# Patient Record
Sex: Female | Born: 1985 | Race: Black or African American | Hispanic: No | Marital: Single | Smoking: Never smoker
Health system: Southern US, Community
[De-identification: ages and names within clinical notes are randomized; demographics above are authoritative.]

## PROBLEM LIST (undated history)

## (undated) DIAGNOSIS — B999 Unspecified infectious disease: Secondary | ICD-10-CM

## (undated) DIAGNOSIS — IMO0002 Reserved for concepts with insufficient information to code with codable children: Secondary | ICD-10-CM

## (undated) DIAGNOSIS — Z3042 Encounter for surveillance of injectable contraceptive: Secondary | ICD-10-CM

## (undated) DIAGNOSIS — F32A Depression, unspecified: Secondary | ICD-10-CM

## (undated) DIAGNOSIS — F329 Major depressive disorder, single episode, unspecified: Secondary | ICD-10-CM

## (undated) DIAGNOSIS — F419 Anxiety disorder, unspecified: Secondary | ICD-10-CM

## (undated) DIAGNOSIS — G5622 Lesion of ulnar nerve, left upper limb: Principal | ICD-10-CM

## (undated) DIAGNOSIS — F410 Panic disorder [episodic paroxysmal anxiety] without agoraphobia: Principal | ICD-10-CM

## (undated) DIAGNOSIS — R112 Nausea with vomiting, unspecified: Secondary | ICD-10-CM

## (undated) HISTORY — DX: Depression, unspecified: F32.A

## (undated) HISTORY — DX: Encounter for surveillance of injectable contraceptive: Z30.42

## (undated) HISTORY — PX: WISDOM TOOTH EXTRACTION: SHX21

## (undated) HISTORY — DX: Anxiety disorder, unspecified: F41.9

## (undated) HISTORY — DX: Lesion of ulnar nerve, left upper limb: G56.22

## (undated) HISTORY — DX: Unspecified infectious disease: B99.9

## (undated) HISTORY — DX: Reserved for concepts with insufficient information to code with codable children: IMO0002

## (undated) HISTORY — PX: NO PAST SURGERIES: SHX2092

## (undated) HISTORY — DX: Panic disorder (episodic paroxysmal anxiety): F41.0

## (undated) HISTORY — DX: Major depressive disorder, single episode, unspecified: F32.9

---
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
## 2007-03-13 DIAGNOSIS — IMO0002 Reserved for concepts with insufficient information to code with codable children: Secondary | ICD-10-CM

## 2007-03-13 DIAGNOSIS — R87619 Unspecified abnormal cytological findings in specimens from cervix uteri: Secondary | ICD-10-CM

## 2007-03-13 HISTORY — DX: Unspecified abnormal cytological findings in specimens from cervix uteri: R87.619

## 2007-03-13 HISTORY — DX: Reserved for concepts with insufficient information to code with codable children: IMO0002

## 2007-12-21 ENCOUNTER — Emergency Department (HOSPITAL_COMMUNITY): Admission: EM | Admit: 2007-12-21 | Discharge: 2007-12-22 | Payer: Self-pay | Admitting: Emergency Medicine

## 2008-01-29 ENCOUNTER — Other Ambulatory Visit: Admission: RE | Admit: 2008-01-29 | Discharge: 2008-01-29 | Payer: Self-pay | Admitting: Obstetrics and Gynecology

## 2008-06-17 ENCOUNTER — Other Ambulatory Visit: Admission: RE | Admit: 2008-06-17 | Discharge: 2008-06-17 | Payer: Self-pay | Admitting: Obstetrics and Gynecology

## 2010-12-12 LAB — URINE MICROSCOPIC-ADD ON

## 2010-12-12 LAB — URINALYSIS, ROUTINE W REFLEX MICROSCOPIC
Glucose, UA: NEGATIVE
Ketones, ur: NEGATIVE
Nitrite: NEGATIVE
Specific Gravity, Urine: 1.029
pH: 8

## 2011-03-13 HISTORY — PX: COLPOSCOPY: SHX161

## 2011-03-13 NOTE — L&D Delivery Note (Signed)
Delivery Note Complete w/ urge to push at 2126.  Pushing started at 2138, and pushed well to SVD at 9:47 PM a viable female "Diane Craig" was delivered via Vaginal, Spontaneous Delivery (Presentation: ; OA ).  APGAR: 6, 9; weight 4 lb 13.6 oz (2200 g).  NICU present for delivery.  Newborn w/ lusty, spontaneous cry as delivered.  Cord around LLE (below the knee) x2.   Placenta status: Intact, Spontaneous, Schultz--sent to pathology.  Uterine atony noted, following placenta delivery, and of cytotec placed pr, and uterus aggressively massaged with good result.  Will CTO closely. Pathology.  Cord: 3 vessels with the following complications: None.  Cord pH: not collected  Anesthesia: Epidural  Episiotomy: None Lacerations: None Suture Repair: n/a Est. Blood Loss (mL): 350  Mom to AICU.  Baby to nursery-stable. Will continue magnesium sulfate for 24 hrs.  At present mom not interested in breast feeding, but enc'd and rec'd to her.   Pt, newborn, and FOB all bonding well. BP remains elevated s/p delivery, and pt to receive IV Labetalol now, and will CTO closely with possibility of ordering po antihypertensive if need be. .. Filed Vitals:   01/09/12 2101 01/09/12 2200 01/09/12 2216 01/09/12 2231  BP: 153/101 187/141 170/112 174/107  Pulse: 91 163 126 110  Temp:      TempSrc:      Resp: 16     Height:      Weight:      SpO2:      C/w MD prn.    Richel Millspaugh H 01/09/2012, 10:37 PM

## 2011-07-02 ENCOUNTER — Telehealth: Payer: Self-pay | Admitting: Obstetrics and Gynecology

## 2011-07-02 MED ORDER — ONDANSETRON 8 MG PO TBDP: 8.0000 mg | ORAL_TABLET | Freq: Three times a day (TID) | ORAL | Status: AC | PRN

## 2011-07-02 NOTE — Telephone Encounter (Signed)
TC from pt. LMP 04/28/11.  Requesting medication for nausea.  Reviewed diet and comfort measures.   Per SL may call in Zofran.   Pt advised to take Colace qd and increase water intake.   To call if no improvement.  Pt verbalizes comprehension.

## 2011-07-16 ENCOUNTER — Other Ambulatory Visit: Payer: Self-pay | Admitting: Obstetrics and Gynecology

## 2011-07-16 ENCOUNTER — Encounter: Payer: Self-pay | Admitting: Obstetrics and Gynecology

## 2011-07-16 ENCOUNTER — Ambulatory Visit (INDEPENDENT_AMBULATORY_CARE_PROVIDER_SITE_OTHER): Payer: Private Health Insurance - Indemnity | Admitting: Obstetrics and Gynecology

## 2011-07-16 DIAGNOSIS — Z331 Pregnant state, incidental: Secondary | ICD-10-CM

## 2011-07-16 LAB — POCT URINALYSIS DIPSTICK
Glucose, UA: NEGATIVE
Ketones, UA: NEGATIVE
Spec Grav, UA: 1.01
Urobilinogen, UA: NEGATIVE

## 2011-07-16 NOTE — Progress Notes (Signed)
Pt states was prescribed Clobetasol hair solution for dandruff.   Is classified Cat C so advised not to use unless absolutely necessary.  To discuss further at NV.  Pt verbalizes comprehension.

## 2011-07-17 LAB — PRENATAL PANEL VII
Basophils Relative: 0 % (ref 0–1)
Eosinophils Absolute: 0.1 10*3/uL (ref 0.0–0.7)
HCT: 38.3 % (ref 36.0–46.0)
Hemoglobin: 12.2 g/dL (ref 12.0–15.0)
Lymphs Abs: 1.5 10*3/uL (ref 0.7–4.0)
MCH: 26.1 pg (ref 26.0–34.0)
MCHC: 31.9 g/dL (ref 30.0–36.0)
MCV: 82 fL (ref 78.0–100.0)
Monocytes Absolute: 0.8 10*3/uL (ref 0.1–1.0)
Monocytes Relative: 11 % (ref 3–12)
RBC: 4.67 MIL/uL (ref 3.87–5.11)
Rh Type: POSITIVE
Rubella: 260.2 IU/mL — ABNORMAL HIGH

## 2011-07-17 LAB — CULTURE, OB URINE
Colony Count: NO GROWTH
Organism ID, Bacteria: NO GROWTH

## 2011-07-17 LAB — VARICELLA ZOSTER ANTIBODY, IGG: Varicella IgG: 1.72 {ISR} — ABNORMAL HIGH

## 2011-07-18 LAB — HEMOGLOBINOPATHY EVALUATION
Hgb F Quant: 0 % (ref 0.0–2.0)
Hgb S Quant: 0 %

## 2011-07-18 LAB — VARICELLA ZOSTER ANTIBODY, IGM: Varicella Zoster Ab IgM: 0.2 (ref <0.91)

## 2011-07-25 ENCOUNTER — Other Ambulatory Visit: Payer: Private Health Insurance - Indemnity

## 2011-07-25 ENCOUNTER — Ambulatory Visit (INDEPENDENT_AMBULATORY_CARE_PROVIDER_SITE_OTHER): Payer: Private Health Insurance - Indemnity

## 2011-07-25 ENCOUNTER — Other Ambulatory Visit: Payer: Self-pay | Admitting: Obstetrics and Gynecology

## 2011-07-25 DIAGNOSIS — Z331 Pregnant state, incidental: Secondary | ICD-10-CM

## 2011-08-01 ENCOUNTER — Ambulatory Visit (INDEPENDENT_AMBULATORY_CARE_PROVIDER_SITE_OTHER): Payer: Private Health Insurance - Indemnity | Admitting: Obstetrics and Gynecology

## 2011-08-01 ENCOUNTER — Encounter: Payer: Self-pay | Admitting: Obstetrics and Gynecology

## 2011-08-01 VITALS — BP 110/70 | Ht 65.0 in | Wt 163.0 lb

## 2011-08-01 DIAGNOSIS — O2601 Excessive weight gain in pregnancy, first trimester: Secondary | ICD-10-CM | POA: Insufficient documentation

## 2011-08-01 DIAGNOSIS — B9689 Other specified bacterial agents as the cause of diseases classified elsewhere: Secondary | ICD-10-CM

## 2011-08-01 DIAGNOSIS — IMO0002 Reserved for concepts with insufficient information to code with codable children: Secondary | ICD-10-CM

## 2011-08-01 DIAGNOSIS — A499 Bacterial infection, unspecified: Secondary | ICD-10-CM

## 2011-08-01 DIAGNOSIS — R635 Abnormal weight gain: Secondary | ICD-10-CM

## 2011-08-01 DIAGNOSIS — Z331 Pregnant state, incidental: Secondary | ICD-10-CM

## 2011-08-01 DIAGNOSIS — N760 Acute vaginitis: Secondary | ICD-10-CM

## 2011-08-01 LAB — POCT WET PREP (WET MOUNT)
Bacteria Wet Prep HPF POC: POSITIVE
Clue Cells Wet Prep Whiff POC: POSITIVE

## 2011-08-01 MED ORDER — TERCONAZOLE 0.4 % VA CREA: 1.0000 | TOPICAL_CREAM | Freq: Every day | VAGINAL | Status: AC

## 2011-08-01 MED ORDER — METRONIDAZOLE 500 MG PO TABS: 500.0000 mg | ORAL_TABLET | Freq: Two times a day (BID) | ORAL | Status: DC

## 2011-08-01 NOTE — Progress Notes (Signed)
Pt wants PNV rf that doesn't cause HA's

## 2011-08-01 NOTE — Progress Notes (Addendum)
Patient ID: Diane Craig, female   DOB: 1985-11-25, 26 y.o.   MRN: [id number] Diane Craig is a 26 y.o. female presenting for new ob visit. States some nausea taking zofran and fewer headaches with zofran but needing stool softner then. @MED  @IPILAPH @ OB History    Grav Para Term Preterm Abortions TAB SAB Ect Mult Living   1              Past Medical History  Diagnosis Date  . Abnormal Pap smear 2009    COLPO  LAST PAP 03/2011  . Infection     YEAST X 1   Past Surgical History  Procedure Date  . No past surgeries    Family History: family history includes Hypertension in her maternal grandfather. Social History:  reports that she has never smoked. She has never used smokeless tobacco. She reports that she does not drink alcohol or use illicit drugs.  @ROS @    Blood pressure 110/70, height 5\' 5"  (1.651 m), weight 163 lb (73.936 kg), last menstrual period 04/28/2011. Physical exam: Calm, no distress, HEENT wnl lungs clear bilaterally, AP RRR, abd soft, gravid, nt, bowel sounds active, abdomen nontender, Fundal height 12 week size Normal hair distrubition mons pubis,  EGBUS WNL, sterile speculum exam,  vagina pink, moist normal rugae,  cerix LTC, no cervical motion tenderness, No adnexal masses or tenderness Scant white discharge FHTS 16 uterus firm 12 weeks size DTR +2 no clonus No edema to lower extremities Current Outpatient Prescriptions on File Prior to Visit  Medication Sig Dispense Refill  . ondansetron (ZOFRAN-ODT) 8 MG disintegrating tablet Take 8 mg by mouth every 8 (eight) hours as needed. UNSURE OF DOSAGE      . Prenatal Vit-Fe Sulfate-FA (PRENATAL VITAMIN PO) Take 1 tablet by mouth. ONE A DAY PRENATAL WITH DHA      . metroNIDAZOLE (FLAGYL) 500 MG tablet Take 1 tablet (500 mg total) by mouth 2 (two) times daily.  14 tablet  0   Prenatal labs: Varicella immune ABO, Rh: B/POS/-- (05/06 0955) Antibody: NEG (05/06 0955) Rubella:  immune RPR: NON REAC (05/06  0955)  HBsAg: NEGATIVE (05/06 0955)  HIV: NON REACTIVE (05/06 0955)  GBS:    Wet prep +clue, +hyphae, -trich Assessment/Plan: 12 4/7 week IUP GC/CHL to labo WET PREP +clue, +hyphae, -trich PAP done 1/13 ULTRASOUND plan for 20 weeks Genetic testing for quad next visit had 1st trimester screen. Discussed risks of excessive wt gain with pregnancy, nutrition and exercise Collaboration with Dr. Vanita Panda, [facility] 08/01/2011, 1:03 PM Diane Craig, CNM

## 2011-08-02 ENCOUNTER — Telehealth: Payer: Self-pay | Admitting: Obstetrics and Gynecology

## 2011-08-02 LAB — GC/CHLAMYDIA PROBE AMP, GENITAL: Chlamydia, DNA Probe: NEGATIVE

## 2011-08-02 NOTE — Telephone Encounter (Signed)
TRIAGE/EPIC °

## 2011-08-02 NOTE — Telephone Encounter (Signed)
Spoke with pt states [facility] didn't rec'vd Prenatal rx. Called [facility] [place] approved Prenatal Vit-Fe Sulfate-FA (PRENATAL VITAMIN PO) 1 po daily x 12rf per MK. Pt aware and voices agreement.

## 2011-08-07 ENCOUNTER — Telehealth: Payer: Self-pay

## 2011-08-07 NOTE — Telephone Encounter (Signed)
Message copied by Janeece Agee on Tue Aug 07, 2011  9:01 AM ------      Message from: Janine Limbo      Created: Mon Aug 06, 2011  9:34 PM       Abnormal first trimester screen. Verify the [facility]. Schedule appointment with first available MD to discuss amnio. AVS

## 2011-08-07 NOTE — Telephone Encounter (Signed)
Informed pt 1st trimester screen results are abnormal & appt is needed to discuss results ASAP. Patient states due date was 02/03/2012 but changed to 02/07/2012. Appointment scheduled 08/08/2011 at 11:15 am with AVS. Pt agrees.

## 2011-08-08 ENCOUNTER — Ambulatory Visit (INDEPENDENT_AMBULATORY_CARE_PROVIDER_SITE_OTHER): Payer: Private Health Insurance - Indemnity | Admitting: Obstetrics and Gynecology

## 2011-08-08 VITALS — BP 102/56 | Wt 162.0 lb

## 2011-08-08 DIAGNOSIS — O285 Abnormal chromosomal and genetic finding on antenatal screening of mother: Secondary | ICD-10-CM | POA: Insufficient documentation

## 2011-08-08 DIAGNOSIS — O289 Unspecified abnormal findings on antenatal screening of mother: Secondary | ICD-10-CM

## 2011-08-08 DIAGNOSIS — Z331 Pregnant state, incidental: Secondary | ICD-10-CM

## 2011-08-08 NOTE — Progress Notes (Signed)
The patient had a first trimester screen that showed an increased risk of Down's syndrome (1 per 87). Genetic screening discussed.  Evaluation options reviewed including Harmony testing and amniocentesis.  The risks and benefits of each of those options were outlined.  The patient has declined any evaluation for now.  Written information was given to the patient to review.  Questions were answered.  The patient will return in 2-3 weeks and we will discuss the issue again at that point.

## 2011-08-08 NOTE — Progress Notes (Signed)
Pt wants PNV that is less expensive. Pt has questions about abnormal third trimester screen.

## 2011-08-27 ENCOUNTER — Ambulatory Visit (INDEPENDENT_AMBULATORY_CARE_PROVIDER_SITE_OTHER): Payer: Private Health Insurance - Indemnity | Admitting: Obstetrics and Gynecology

## 2011-08-27 ENCOUNTER — Encounter: Payer: Self-pay | Admitting: Obstetrics and Gynecology

## 2011-08-27 ENCOUNTER — Other Ambulatory Visit: Payer: Private Health Insurance - Indemnity

## 2011-08-27 VITALS — BP 112/60 | Wt 167.0 lb

## 2011-08-27 DIAGNOSIS — O289 Unspecified abnormal findings on antenatal screening of mother: Secondary | ICD-10-CM

## 2011-08-27 DIAGNOSIS — Z331 Pregnant state, incidental: Secondary | ICD-10-CM

## 2011-08-27 DIAGNOSIS — O285 Abnormal chromosomal and genetic finding on antenatal screening of mother: Secondary | ICD-10-CM

## 2011-08-27 NOTE — Progress Notes (Signed)
Pt without complaints today

## 2011-08-27 NOTE — Progress Notes (Signed)
Doing well overall, but with questions regarding elevated Down's risk on 1st trimester screen (1:87).  Questions reviewed, patient still declines Harmony or amniocentesis.  Unsure if she will have AFP--will decide by NV. [place] NV for anatomy.

## 2011-09-10 ENCOUNTER — Encounter: Payer: Self-pay | Admitting: Obstetrics and Gynecology

## 2011-09-10 ENCOUNTER — Ambulatory Visit (INDEPENDENT_AMBULATORY_CARE_PROVIDER_SITE_OTHER): Payer: Private Health Insurance - Indemnity

## 2011-09-10 ENCOUNTER — Ambulatory Visit (INDEPENDENT_AMBULATORY_CARE_PROVIDER_SITE_OTHER): Payer: Private Health Insurance - Indemnity | Admitting: Obstetrics and Gynecology

## 2011-09-10 VITALS — BP 112/54 | Wt 167.0 lb

## 2011-09-10 DIAGNOSIS — O26849 Uterine size-date discrepancy, unspecified trimester: Secondary | ICD-10-CM

## 2011-09-10 DIAGNOSIS — O285 Abnormal chromosomal and genetic finding on antenatal screening of mother: Secondary | ICD-10-CM

## 2011-09-10 DIAGNOSIS — Z331 Pregnant state, incidental: Secondary | ICD-10-CM

## 2011-09-10 DIAGNOSIS — O289 Unspecified abnormal findings on antenatal screening of mother: Secondary | ICD-10-CM

## 2011-09-10 NOTE — Progress Notes (Signed)
No complaints

## 2011-09-10 NOTE — Progress Notes (Signed)
Increased risk of Down syndrome based on first trimester screen (1: 87).  Declines  AFP, amniocentesis, and harmony screen. Ultrasound: Single gestation, normal fluid, cervix 3.23 cm, normal anatomy, female, 18 weeks and 1 day (18th percentile). Doing well. Return to office in 4 weeks.  Repeat ultrasound for growth/small for gestational age next visit. Dr. Stefano Gaul

## 2011-09-20 ENCOUNTER — Other Ambulatory Visit: Payer: Self-pay | Admitting: Obstetrics and Gynecology

## 2011-09-20 DIAGNOSIS — O285 Abnormal chromosomal and genetic finding on antenatal screening of mother: Secondary | ICD-10-CM

## 2011-09-20 LAB — US OB DETAIL + 14 WK

## 2011-10-08 ENCOUNTER — Ambulatory Visit: Payer: Self-pay

## 2011-10-08 ENCOUNTER — Ambulatory Visit (INDEPENDENT_AMBULATORY_CARE_PROVIDER_SITE_OTHER): Payer: Private Health Insurance - Indemnity | Admitting: Obstetrics and Gynecology

## 2011-10-08 ENCOUNTER — Other Ambulatory Visit: Payer: Self-pay | Admitting: Obstetrics and Gynecology

## 2011-10-08 ENCOUNTER — Encounter: Payer: Self-pay | Admitting: Obstetrics and Gynecology

## 2011-10-08 ENCOUNTER — Ambulatory Visit (INDEPENDENT_AMBULATORY_CARE_PROVIDER_SITE_OTHER): Payer: Private Health Insurance - Indemnity

## 2011-10-08 VITALS — BP 118/58 | Wt 173.0 lb

## 2011-10-08 DIAGNOSIS — R6252 Short stature (child): Secondary | ICD-10-CM

## 2011-10-08 DIAGNOSIS — O26849 Uterine size-date discrepancy, unspecified trimester: Secondary | ICD-10-CM

## 2011-10-08 DIAGNOSIS — O289 Unspecified abnormal findings on antenatal screening of mother: Secondary | ICD-10-CM

## 2011-10-08 DIAGNOSIS — O285 Abnormal chromosomal and genetic finding on antenatal screening of mother: Secondary | ICD-10-CM

## 2011-10-08 NOTE — Progress Notes (Signed)
No complaints. . Mw,cma

## 2011-10-08 NOTE — Progress Notes (Addendum)
Refer for fetal echo secondary to inc risk for Trisomy 21 and declined confirmatory testing U/S q4wks starting at 28-30wks RTO 4wks Glucola at NV Ultrasound today consistent with dates cervix 3.1 cm normal fluid vertex anterior placenta

## 2011-10-11 LAB — US OB FOLLOW UP

## 2011-10-24 ENCOUNTER — Telehealth: Payer: Self-pay | Admitting: Obstetrics and Gynecology

## 2011-10-24 NOTE — Telephone Encounter (Signed)
TRIAGE/OB/EPIC

## 2011-10-24 NOTE — Telephone Encounter (Signed)
Lm on vm tcb rgd msg 

## 2011-10-24 NOTE — Telephone Encounter (Signed)
Pt returned Diane Craig. Pt was calling asking if she can take Robitussin. I informed the pt per protocol that Plain Robitussin is fine. Pt stated she did purchase the plain Robitussin. Pt voiced understanding.  [facility] CMA

## 2011-11-05 ENCOUNTER — Ambulatory Visit (INDEPENDENT_AMBULATORY_CARE_PROVIDER_SITE_OTHER): Payer: Private Health Insurance - Indemnity | Admitting: Obstetrics and Gynecology

## 2011-11-05 ENCOUNTER — Other Ambulatory Visit: Payer: Private Health Insurance - Indemnity

## 2011-11-05 ENCOUNTER — Encounter: Payer: Self-pay | Admitting: Obstetrics and Gynecology

## 2011-11-05 VITALS — BP 110/60 | Wt 178.0 lb

## 2011-11-05 DIAGNOSIS — Z331 Pregnant state, incidental: Secondary | ICD-10-CM

## 2011-11-05 DIAGNOSIS — O285 Abnormal chromosomal and genetic finding on antenatal screening of mother: Secondary | ICD-10-CM

## 2011-11-05 DIAGNOSIS — O289 Unspecified abnormal findings on antenatal screening of mother: Secondary | ICD-10-CM

## 2011-11-05 NOTE — Progress Notes (Signed)
[redacted]w[redacted]d Glucola today

## 2011-11-05 NOTE — Progress Notes (Signed)
Pt wants to make sure u/s showed that she is having a girl.

## 2011-11-08 LAB — GLUCOSE TOLERANCE, 1 HOUR (50G) W/O FASTING: Glucose, 1 Hour GTT: 154 mg/dL — ABNORMAL HIGH (ref 70–140)

## 2011-11-13 NOTE — Progress Notes (Signed)
Quick Note:  Abnormal 1 hour Gucola. Needs 3 hour GTT scheduled. ______ 

## 2011-11-14 ENCOUNTER — Telehealth: Payer: Self-pay | Admitting: Obstetrics and Gynecology

## 2011-11-14 DIAGNOSIS — O99810 Abnormal glucose complicating pregnancy: Secondary | ICD-10-CM

## 2011-11-14 NOTE — Telephone Encounter (Signed)
Notified pt that 1 hr glucola was elevated.  Sch pt for 3 hr GTT on 11-21-2011 mailed pt diet and appt info.

## 2011-11-20 ENCOUNTER — Ambulatory Visit (INDEPENDENT_AMBULATORY_CARE_PROVIDER_SITE_OTHER): Payer: Managed Care, Other (non HMO) | Admitting: Obstetrics and Gynecology

## 2011-11-20 ENCOUNTER — Encounter: Payer: Self-pay | Admitting: Obstetrics and Gynecology

## 2011-11-20 VITALS — BP 110/58 | Wt 182.0 lb

## 2011-11-20 DIAGNOSIS — O285 Abnormal chromosomal and genetic finding on antenatal screening of mother: Secondary | ICD-10-CM

## 2011-11-20 DIAGNOSIS — O289 Unspecified abnormal findings on antenatal screening of mother: Secondary | ICD-10-CM

## 2011-11-20 NOTE — Progress Notes (Signed)
Office Visit on 11/05/2011  Component Date Value Range Status  . RPR 11/05/2011 NON REAC  NON REAC Final  . Hemoglobin 11/05/2011 11.0* 12.0 - 15.0 g/dL Final  . Glucose, 1 Hour GTT 11/05/2011 154* 70 - 140 mg/dL Final   3hr GTT tomorrow No complaints Pt reports nl fetal echo FKCs RTO 2wks for ROB and U/S for EFW q4wks secondary to inc trisomy 21 risk

## 2011-11-22 LAB — GLUCOSE TOLERANCE, 3 HOURS
Glucose Tolerance, 1 hour: 138 mg/dL (ref 70–189)
Glucose Tolerance, 2 hour: 91 mg/dL (ref 70–164)
Glucose Tolerance, Fasting: 86 mg/dL (ref 70–104)
Glucose, GTT - 3 Hour: 119 mg/dL (ref 70–144)

## 2011-12-03 ENCOUNTER — Encounter: Payer: Private Health Insurance - Indemnity | Admitting: Obstetrics and Gynecology

## 2011-12-03 ENCOUNTER — Other Ambulatory Visit: Payer: Private Health Insurance - Indemnity

## 2011-12-04 ENCOUNTER — Ambulatory Visit (INDEPENDENT_AMBULATORY_CARE_PROVIDER_SITE_OTHER): Payer: Managed Care, Other (non HMO) | Admitting: Obstetrics and Gynecology

## 2011-12-04 ENCOUNTER — Ambulatory Visit (INDEPENDENT_AMBULATORY_CARE_PROVIDER_SITE_OTHER): Payer: Managed Care, Other (non HMO)

## 2011-12-04 VITALS — BP 122/64 | Wt 190.0 lb

## 2011-12-04 DIAGNOSIS — Z3490 Encounter for supervision of normal pregnancy, unspecified, unspecified trimester: Secondary | ICD-10-CM

## 2011-12-04 DIAGNOSIS — O285 Abnormal chromosomal and genetic finding on antenatal screening of mother: Secondary | ICD-10-CM

## 2011-12-04 DIAGNOSIS — O289 Unspecified abnormal findings on antenatal screening of mother: Secondary | ICD-10-CM

## 2011-12-04 DIAGNOSIS — Z331 Pregnant state, incidental: Secondary | ICD-10-CM

## 2011-12-04 NOTE — Progress Notes (Addendum)
[redacted]w[redacted]d No complaints Increased risk for trisomy 21 patient had growth ultrasound today showing an estimated fetal weight of 3 lbs. 5 oz. 27th percentile and was tagged with a femur length and EDC is a week less than gestational age cervix is closed placenta is anterior vertex presentation is noted and normal fluid at the 55th percentile with an AFI of 15.7 Fetal kick counts and labor precautions Patient will return to office in 2 weeks and have a repeat growth ultrasound to follow up on lagging growth with what appears to be the femur length and slightly the AC Pt reports nl fetal echo

## 2011-12-10 LAB — US OB FOLLOW UP

## 2011-12-19 ENCOUNTER — Ambulatory Visit (INDEPENDENT_AMBULATORY_CARE_PROVIDER_SITE_OTHER): Payer: Managed Care, Other (non HMO) | Admitting: Obstetrics and Gynecology

## 2011-12-19 ENCOUNTER — Other Ambulatory Visit: Payer: Self-pay | Admitting: Obstetrics and Gynecology

## 2011-12-19 ENCOUNTER — Ambulatory Visit (INDEPENDENT_AMBULATORY_CARE_PROVIDER_SITE_OTHER): Payer: Managed Care, Other (non HMO)

## 2011-12-19 VITALS — BP 120/62 | Wt 193.0 lb

## 2011-12-19 DIAGNOSIS — O285 Abnormal chromosomal and genetic finding on antenatal screening of mother: Secondary | ICD-10-CM

## 2011-12-19 DIAGNOSIS — O289 Unspecified abnormal findings on antenatal screening of mother: Secondary | ICD-10-CM

## 2011-12-19 NOTE — Progress Notes (Signed)
[redacted]w[redacted]d No complaints Ultrasound today estimated fetal weight 4 lbs. 0 oz. 12.6%, cervix 3.98 cm AFI of 15.9 cm Vertex anterior placenta and normal fluid BPP of 8 out of 8 and estimated fetal weight at the 12% today is decreased from the ultrasound previously which showed the estimated fetal weight to be at 27th percentile and femur and humerus are still lagging a.c. Is also less than gestational age, suggest followup growth in 2 weeks Questions answered rec antepartum testing.  Pt opts for weekly BPP and EFW in 2wks [facility]

## 2011-12-20 LAB — US OB FOLLOW UP

## 2011-12-25 ENCOUNTER — Ambulatory Visit (INDEPENDENT_AMBULATORY_CARE_PROVIDER_SITE_OTHER): Payer: Managed Care, Other (non HMO)

## 2011-12-25 ENCOUNTER — Ambulatory Visit (INDEPENDENT_AMBULATORY_CARE_PROVIDER_SITE_OTHER): Payer: Managed Care, Other (non HMO) | Admitting: Obstetrics and Gynecology

## 2011-12-25 VITALS — BP 110/66 | Wt 198.0 lb

## 2011-12-25 DIAGNOSIS — O285 Abnormal chromosomal and genetic finding on antenatal screening of mother: Secondary | ICD-10-CM

## 2011-12-25 DIAGNOSIS — Z331 Pregnant state, incidental: Secondary | ICD-10-CM

## 2011-12-25 DIAGNOSIS — O289 Unspecified abnormal findings on antenatal screening of mother: Secondary | ICD-10-CM

## 2011-12-25 DIAGNOSIS — O280 Abnormal hematological finding on antenatal screening of mother: Secondary | ICD-10-CM

## 2011-12-25 NOTE — Progress Notes (Signed)
[redacted]w[redacted]d Ultrasound with BPP 8/8 and normal AFI. Last growth showed 12%. Will repeat growth next week GFM No complaints

## 2011-12-25 NOTE — Progress Notes (Signed)
Pt stated no issues today. Advised pt to increase fluids . F/u today

## 2012-01-02 ENCOUNTER — Ambulatory Visit (INDEPENDENT_AMBULATORY_CARE_PROVIDER_SITE_OTHER): Payer: Managed Care, Other (non HMO)

## 2012-01-02 ENCOUNTER — Other Ambulatory Visit: Payer: Self-pay | Admitting: Obstetrics and Gynecology

## 2012-01-02 ENCOUNTER — Ambulatory Visit (INDEPENDENT_AMBULATORY_CARE_PROVIDER_SITE_OTHER): Payer: Managed Care, Other (non HMO) | Admitting: Obstetrics and Gynecology

## 2012-01-02 ENCOUNTER — Encounter: Payer: Self-pay | Admitting: Obstetrics and Gynecology

## 2012-01-02 VITALS — BP 124/90 | Wt 202.0 lb

## 2012-01-02 DIAGNOSIS — O285 Abnormal chromosomal and genetic finding on antenatal screening of mother: Secondary | ICD-10-CM

## 2012-01-02 DIAGNOSIS — O280 Abnormal hematological finding on antenatal screening of mother: Secondary | ICD-10-CM

## 2012-01-02 DIAGNOSIS — O289 Unspecified abnormal findings on antenatal screening of mother: Secondary | ICD-10-CM

## 2012-01-02 NOTE — Progress Notes (Signed)
[redacted]w[redacted]d Inc tri 21 risk and SGA - for which is getting antepartum testing Ultrasound today - EFW 5lbs 5oz, 34%, AFI 13.4, 45%, BPP 6/8, vtx, ant placenta U/S BPP 6/8 NST Reactive FKCs and Labor Precautions Declined flu vaccine RTO 1wk NST on Fri BPP and ROB in 1wk

## 2012-01-04 ENCOUNTER — Other Ambulatory Visit: Payer: Private Health Insurance - Indemnity

## 2012-01-04 ENCOUNTER — Telehealth: Payer: Self-pay | Admitting: Obstetrics and Gynecology

## 2012-01-04 LAB — US OB FOLLOW UP

## 2012-01-04 NOTE — Telephone Encounter (Signed)
Tc to pt regarding missed NST this am.  Lm on vm to call back.

## 2012-01-07 ENCOUNTER — Telehealth: Payer: Self-pay | Admitting: Obstetrics and Gynecology

## 2012-01-07 NOTE — Telephone Encounter (Signed)
Lm on pt's voice mail to call back rgd message.

## 2012-01-08 ENCOUNTER — Ambulatory Visit (INDEPENDENT_AMBULATORY_CARE_PROVIDER_SITE_OTHER): Payer: Managed Care, Other (non HMO) | Admitting: Obstetrics and Gynecology

## 2012-01-08 ENCOUNTER — Encounter: Payer: Self-pay | Admitting: Obstetrics and Gynecology

## 2012-01-08 ENCOUNTER — Inpatient Hospital Stay (HOSPITAL_COMMUNITY)
Admission: AD | Admit: 2012-01-08 | Discharge: 2012-01-12 | DRG: 774 | Disposition: A | Discharge status: Home / Self Care | Payer: 59 | Source: Ambulatory Visit | Attending: Obstetrics and Gynecology | Admitting: Obstetrics and Gynecology

## 2012-01-08 ENCOUNTER — Encounter (HOSPITAL_COMMUNITY): Payer: Self-pay

## 2012-01-08 VITALS — BP 142/82 | Wt 206.0 lb

## 2012-01-08 DIAGNOSIS — O285 Abnormal chromosomal and genetic finding on antenatal screening of mother: Secondary | ICD-10-CM | POA: Diagnosis present

## 2012-01-08 DIAGNOSIS — O1490 Unspecified pre-eclampsia, unspecified trimester: Secondary | ICD-10-CM

## 2012-01-08 DIAGNOSIS — O1414 Severe pre-eclampsia complicating childbirth: Principal | ICD-10-CM | POA: Diagnosis present

## 2012-01-08 DIAGNOSIS — IMO0002 Reserved for concepts with insufficient information to code with codable children: Secondary | ICD-10-CM

## 2012-01-08 DIAGNOSIS — O1410 Severe pre-eclampsia, unspecified trimester: Secondary | ICD-10-CM | POA: Diagnosis present

## 2012-01-08 DIAGNOSIS — R7309 Other abnormal glucose: Secondary | ICD-10-CM | POA: Insufficient documentation

## 2012-01-08 DIAGNOSIS — O365990 Maternal care for other known or suspected poor fetal growth, unspecified trimester, not applicable or unspecified: Secondary | ICD-10-CM | POA: Diagnosis present

## 2012-01-08 DIAGNOSIS — Z331 Pregnant state, incidental: Secondary | ICD-10-CM

## 2012-01-08 LAB — URINE MICROSCOPIC-ADD ON

## 2012-01-08 LAB — CBC
HCT: 32 % — ABNORMAL LOW (ref 36.0–46.0)
MCH: 26 pg (ref 26.0–34.0)
MCHC: 32.2 g/dL (ref 30.0–36.0)
MCV: 80.8 fL (ref 78.0–100.0)
Platelets: 167 10*3/uL (ref 150–400)
RDW: 14.4 % (ref 11.5–15.5)
WBC: 7.7 10*3/uL (ref 4.0–10.5)

## 2012-01-08 LAB — URINALYSIS, ROUTINE W REFLEX MICROSCOPIC
Bilirubin Urine: NEGATIVE
Ketones, ur: NEGATIVE mg/dL
Leukocytes, UA: NEGATIVE
Nitrite: NEGATIVE
Protein, ur: >300 mg/dL — AB
Specific Gravity, Urine: 1.015 (ref 1.005–1.030)
Urobilinogen, UA: 0.2 mg/dL (ref 0.0–1.0)
Urobilinogen, UA: 0.2 mg/dL (ref 0.0–1.0)
pH: 6.5 (ref 5.0–8.0)

## 2012-01-08 LAB — COMPREHENSIVE METABOLIC PANEL
AST: 23 U/L (ref 0–37)
Albumin: 2.5 g/dL — ABNORMAL LOW (ref 3.5–5.2)
BUN: 10 mg/dL (ref 6–23)
Calcium: 9 mg/dL (ref 8.4–10.5)
Creatinine, Ser: 0.88 mg/dL (ref 0.50–1.10)
Total Protein: 6.1 g/dL (ref 6.0–8.3)

## 2012-01-08 LAB — LACTATE DEHYDROGENASE: LDH: 213 U/L (ref 94–250)

## 2012-01-08 LAB — ABO/RH: ABO/RH(D): B POS

## 2012-01-08 LAB — GROUP B STREP BY PCR: Group B strep by PCR: NEGATIVE

## 2012-01-08 LAB — URIC ACID: Uric Acid, Serum: 5.5 mg/dL (ref 2.4–7.0)

## 2012-01-08 LAB — OB RESULTS CONSOLE GBS: GBS: NEGATIVE

## 2012-01-08 MED ORDER — LACTATED RINGERS IV SOLN
INTRAVENOUS | Status: DC
  Administered 2012-01-08 – 2012-01-09 (×6): via INTRAVENOUS

## 2012-01-08 MED ORDER — CITRIC ACID-SODIUM CITRATE 334-500 MG/5ML PO SOLN: 30.0000 mL | ORAL | Status: DC | PRN

## 2012-01-08 MED ORDER — MISOPROSTOL 25 MCG QUARTER TABLET
25.0000 ug | ORAL_TABLET | ORAL | Status: DC | PRN
  Filled 2012-01-08: qty 0.25

## 2012-01-08 MED ORDER — LIDOCAINE HCL (PF) 1 % IJ SOLN
30.0000 mL | INTRAMUSCULAR | Status: DC | PRN
  Filled 2012-01-08: qty 30

## 2012-01-08 MED ORDER — MAGNESIUM SULFATE 40 G IN LACTATED RINGERS - SIMPLE
2.0000 g/h | INTRAVENOUS | Status: AC
  Administered 2012-01-09 – 2012-01-10 (×2): 2 g/h via INTRAVENOUS
  Filled 2012-01-08 (×3): qty 500

## 2012-01-08 MED ORDER — OXYTOCIN 40 UNITS IN LACTATED RINGERS INFUSION - SIMPLE MED
62.5000 mL/h | INTRAVENOUS | Status: DC
  Administered 2012-01-09: 62.5 mL/h via INTRAVENOUS
  Filled 2012-01-08 (×2): qty 1000

## 2012-01-08 MED ORDER — MAGNESIUM SULFATE 40 G IN LACTATED RINGERS - SIMPLE: 2.0000 g/h | INTRAVENOUS | Status: DC

## 2012-01-08 MED ORDER — OXYTOCIN BOLUS FROM INFUSION
500.0000 mL | INTRAVENOUS | Status: DC
  Administered 2012-01-09: 500 mL via INTRAVENOUS
  Filled 2012-01-08 (×97): qty 500

## 2012-01-08 MED ORDER — TERBUTALINE SULFATE 1 MG/ML IJ SOLN: 0.2500 mg | Freq: Once | INTRAMUSCULAR | Status: DC | PRN

## 2012-01-08 MED ORDER — LACTATED RINGERS IV SOLN
500.0000 mL | INTRAVENOUS | Status: DC | PRN
  Administered 2012-01-09: 500 mL via INTRAVENOUS

## 2012-01-08 MED ORDER — MAGNESIUM SULFATE 40 MG/ML IJ SOLN: 4.0000 g | Freq: Once | INTRAMUSCULAR | Status: DC

## 2012-01-08 MED ORDER — PROMETHAZINE HCL 25 MG/ML IJ SOLN
25.0000 mg | INTRAMUSCULAR | Status: DC | PRN
  Administered 2012-01-08: 25 mg via INTRAVENOUS
  Filled 2012-01-08: qty 1

## 2012-01-08 MED ORDER — LABETALOL HCL 5 MG/ML IV SOLN
10.0000 mg | INTRAVENOUS | Status: DC | PRN
  Administered 2012-01-09: 10 mg via INTRAVENOUS
  Administered 2012-01-09 (×3): 20 mg via INTRAVENOUS
  Administered 2012-01-09: 40 mg via INTRAVENOUS
  Administered 2012-01-09 – 2012-01-10 (×5): 10 mg via INTRAVENOUS
  Filled 2012-01-08 (×5): qty 4
  Filled 2012-01-08 (×2): qty 8
  Filled 2012-01-08 (×4): qty 4
  Filled 2012-01-08: qty 8

## 2012-01-08 MED ORDER — TERBUTALINE SULFATE 1 MG/ML IJ SOLN: 0.2500 mg | Freq: Once | INTRAMUSCULAR | Status: AC | PRN

## 2012-01-08 MED ORDER — ONDANSETRON HCL 4 MG/2ML IJ SOLN: 4.0000 mg | Freq: Four times a day (QID) | INTRAMUSCULAR | Status: DC | PRN

## 2012-01-08 MED ORDER — OXYCODONE-ACETAMINOPHEN 5-325 MG PO TABS: 1.0000 | ORAL_TABLET | ORAL | Status: DC | PRN

## 2012-01-08 MED ORDER — ACETAMINOPHEN 500 MG PO TABS: 1000.0000 mg | ORAL_TABLET | ORAL | Status: DC | PRN

## 2012-01-08 MED ORDER — NALBUPHINE SYRINGE 5 MG/0.5 ML
10.0000 mg | INJECTION | INTRAMUSCULAR | Status: DC | PRN
  Administered 2012-01-08: 10 mg via INTRAVENOUS
  Filled 2012-01-08: qty 0.5
  Filled 2012-01-08: qty 1
  Filled 2012-01-08: qty 0.5

## 2012-01-08 MED ORDER — LABETALOL HCL 5 MG/ML IV SOLN
10.0000 mg | INTRAVENOUS | Status: DC | PRN
  Administered 2012-01-08: 10 mg via INTRAVENOUS
  Filled 2012-01-08: qty 4

## 2012-01-08 MED ORDER — OXYTOCIN 40 UNITS IN LACTATED RINGERS INFUSION - SIMPLE MED
1.0000 m[IU]/min | INTRAVENOUS | Status: DC
  Administered 2012-01-08: 1 m[IU]/min via INTRAVENOUS
  Administered 2012-01-09: 15 m[IU]/min via INTRAVENOUS

## 2012-01-08 MED ORDER — MAGNESIUM SULFATE BOLUS VIA INFUSION
4.0000 g | Freq: Once | INTRAVENOUS | Status: AC
  Administered 2012-01-08: 4 g via INTRAVENOUS
  Filled 2012-01-08: qty 500

## 2012-01-08 MED ORDER — MAGNESIUM SULFATE BOLUS VIA INFUSION
4.0000 g | Freq: Once | INTRAVENOUS | Status: DC
  Filled 2012-01-08: qty 500

## 2012-01-08 MED ORDER — IBUPROFEN 600 MG PO TABS: 600.0000 mg | ORAL_TABLET | Freq: Four times a day (QID) | ORAL | Status: DC | PRN

## 2012-01-08 NOTE — MAU Note (Signed)
Pt states was told in office she may be admitted for delivery of her baby because of blood pressures and protein in her urine. Denies PIH s/s.

## 2012-01-08 NOTE — Progress Notes (Signed)
Diane Craig is a 26 y.o. G1P0000 at [redacted]w[redacted]d by ultrasound admitted for induction of labor due to Pre-eclamptic toxemia of pregnancy..  Subjective:  Denies HA, nausea, vomiting or abdominal pain  Objective: BP 135/92  Pulse 87  Temp 98.8 F (37.1 C) (Oral)  Resp 18  Ht 5\' 5"  (1.651 m)  Wt 206 lb (93.441 kg)  BMI 34.28 kg/m2  SpO2 98%  LMP 04/28/2011   Total I/O In: 740.4 [P.O.:360; I.V.:380.4] Out: 250 [Urine:250]  FHT:  FHR: 130s bpm, variability: moderate,  accelerations:  Present,  decelerations:  Absent UC:   irregular SVE:   Dilation: 1 Effacement (%): Thick Exam by:: Lillard, CNM  Labs: Lab Results  Component Value Date   WBC 7.7 01/08/2012   HGB 10.3* 01/08/2012   HCT 32.0* 01/08/2012   MCV 80.8 01/08/2012   PLT 167 01/08/2012    Assessment / Plan: Pregnancy at [redacted]w[redacted]d Pre eclampsia with increased DSR, questionable adequacy of fetal growth .  Labor: needs induction Preeclampsia:  on magnesium sulfate and labs stable Fetal Wellbeing:  Category I Pain Control:  deferred I/D:  n/a Anticipated MOD:  NSVD All pts questions answered.  Will proceed with induction.  Diane Craig P 01/08/2012, 5:42 PM

## 2012-01-08 NOTE — Progress Notes (Signed)
[redacted]w[redacted]d GBS today C/o swelling in feet, states that she is also having a hard time sleeping at night.

## 2012-01-08 NOTE — Progress Notes (Signed)
Gevena Barre CNM in room to check on pt.  Reviewed strip and gave orders to begin increasing pitocin even though foley bulb is not out.

## 2012-01-08 NOTE — Progress Notes (Signed)
Patient ID: Felecity Stade, female   DOB: 09-30-1985, 26 y.o.   MRN: [id number] .Subjective:  Resting, pain meds have helped, sleepy now, family at bs   Objective: BP 152/98  Pulse 89  Temp 99.1 F (37.3 C) (Oral)  Resp 18  Ht 5\' 5"  (1.651 m)  Wt 206 lb (93.441 kg)  BMI 34.28 kg/m2  SpO2 98%  LMP 04/28/2011  Filed Vitals:   01/08/12 1836 01/08/12 1900 01/08/12 1930 01/08/12 2000  BP: 157/94  152/80 152/98  Pulse: 81  79 89  Temp: 99.1 F (37.3 C)     TempSrc: Oral     Resp: 20 20 18 18   Height:      Weight:      SpO2:         FHT:  FHR: 130 bpm, variability: moderate,  accelerations:  Present,  decelerations:  Absent UC:   irregular, every 5-7 minutes SVE:   Dilation: 1 Effacement (%): Thick Exam by:: Dre Gamino, CNM  VE deferred, traction on foley bulb  Pitocin at 4mu  Assessment / Plan: IOL for severe pre-eclampsia  Foley bulb in place Ctx spaced after IV meds, will titrate for labor   Fetal Wellbeing:  Category I Pain Control:  nubain/phenergan  Update physician PRN  Malissa Hippo 01/08/2012, 8:16 PM

## 2012-01-08 NOTE — H&P (Signed)
Diane Craig is a 26 y.o. female presenting for PIH eval from office at [redacted]w[redacted]d. Pt had work-in appt at office today for c/o increased swelling in LE, BP was noted to be elevated in addition to proteinuria. After observation in MAU BP's remained elevated 150's/90's. Pt denies any other PIH sx's, no HA/N/V/RUQ pain or visual disturbances. States she has had swelling the last 2-3wks but getting more uncomfortable. She denies any ctx, VB or LOF, reports +FM   HPI: Pt began [facility] at 12wks. EDC based on 1st trimester [place] = 11/28. 1st trimester screen abnormal w elevated DSR 1:87. Pt declined any further testing, including AFP. Anatomy scan at 18wks was WNL, EFW was 18%. F/u US at 22wks c/w dates. Fetal echo WNL. Elevated 1hr gtt at 26wks, 3hr WNL. At 30wks EFW 27% w lagging FL slight AC lag. At 32wks EFW 12% w FL, HL lagging and AC <GA. BPP 8/8. [place] at 34wks EFW =34% BPP 6/8 and NST reactive. BP baseline was 110/70 and remained WNL until visit at 34wks BP slightly elevated at 120's/90. BP in office today was 142/82 and 3+ proteinuria noted. Pt had a TWG 62#, with 4# in the last 6 days.  Maternal Medical History:  Reason for admission: Reason for Admission:   nauseaSevere preeclampsia   Contractions: Denies ctx   Fetal activity: Perceived fetal activity is normal.   Last perceived fetal movement was within the past hour.    Prenatal complications: IUGR and pre-eclampsia.     OB History    Grav Para Term Preterm Abortions TAB SAB Ect Mult Living   1              Past Medical History  Diagnosis Date  . Abnormal Pap smear 2009    COLPO  LAST PAP 03/2011  . Infection     YEAST X 1   Past Surgical History  Procedure Date  . No past surgeries    Family History: family history includes Hypertension in her maternal grandfather. Social History:  reports that she has never smoked. She has never used smokeless tobacco. She reports that she does not drink alcohol or use illicit drugs.   Prenatal  Transfer Tool  Maternal Diabetes: No Genetic Screening: increased DSR on 1st trim screen, declined AFP  Maternal Ultrasounds/Referrals: Abnormal:  Findings:   IUGR Fetal Ultrasounds or other Referrals:  Fetal echo WNL Maternal Substance Abuse:  No Significant Maternal Medications:  None Significant Maternal Lab Results:  None Other Comments:  None  Review of Systems  Eyes: Negative for blurred vision.  Respiratory: Negative for shortness of breath.   Cardiovascular: Positive for leg swelling. Negative for chest pain.  Gastrointestinal: Negative for heartburn, nausea, vomiting and abdominal pain.  Genitourinary: Negative for dysuria.  Musculoskeletal: Negative for myalgias.  Neurological: Negative for dizziness and headaches.  All other systems reviewed and are negative.      Blood pressure 154/100, pulse 79, height 5\' 5"  (1.651 Craig), weight 206 lb (93.441 kg), last menstrual period 04/28/2011. Maternal Exam:  Uterine Assessment: Contraction strength is mild.  Contraction duration is 50 seconds. Contraction frequency is irregular.   Abdomen: Patient reports no abdominal tenderness. Fundal height is 35cm.   Fetal presentation: vertex  Introitus: Normal vulva. Normal vagina.  Vagina is negative for discharge.  Pelvis: adequate for delivery.   Cervix: Cervix evaluated by digital exam.     Fetal Exam Fetal Monitor Review: Mode: ultrasound.   Baseline rate: 130.  Variability: moderate (6-25 bpm).  Pattern: accelerations present and no decelerations.    Fetal State Assessment: Category I - tracings are normal.     Physical Exam  Nursing note and vitals reviewed. Constitutional: She is oriented to person, place, and time. She appears well-developed and well-nourished.  HENT:  Head: Normocephalic.  Eyes: Pupils are equal, round, and reactive to light.  Neck: Normal range of motion.  Cardiovascular: Normal rate, regular rhythm, normal heart sounds and intact distal pulses.     Respiratory: Effort normal and breath sounds normal.  GI: Soft. Bowel sounds are normal.  Genitourinary: Vagina normal. No vaginal discharge found.  Musculoskeletal: Normal range of motion. She exhibits edema. She exhibits no tenderness.       2+ BLE non-pitting   Neurological: She is alert and oriented to person, place, and time. She has normal reflexes.       Neg clonus  Skin: Skin is warm and dry.  Psychiatric: She has a normal mood and affect. Her behavior is normal.    Prenatal labs: ABO, Rh: B/POS/-- (05/06 0955) Antibody: NEG (05/06 0955) Rubella: 260.2 (05/06 0955) RPR: NON REAC (08/26 1039)  HBsAg: NEGATIVE (05/06 0955)  HIV: NON REACTIVE (05/06 0955)  GBS:   pending GC/CT neg at [facility], pending from 10/29 Varicella Imm hgb electrophoresis WNL    Assessment/Plan: IUP at [redacted]w[redacted]d Severe pre-eclampsia  FHR reassuring GBS pending - PCR sent  Non-favorable cervix  IUGR   Admit to b.s per c/w Dr Pennie Rushing Routine L&D orders Strict I/O BP hourly  Cervical ripening then pitocin induction Labetalol PRN for BP >160/105     Diane Craig 01/08/2012, 2:02 PM

## 2012-01-08 NOTE — MAU Note (Signed)
Patient is sent from the office for pih eval. She states that she have swelling in her hands and feet. She denies headache, visual disturbance, epigastric pain, contractions or vaginal bleeding or lof. She reports good fetal movement. She have 2+ reflexes, no clonus and 2+ bilateral lower extremity edema.

## 2012-01-08 NOTE — Progress Notes (Signed)
Patient ID: Diane Craig, female   DOB: 1985-05-31, 26 y.o.   MRN: [id number] .Subjective:  Pt denies any pain or ctx  Objective: BP 143/84  Pulse 84  Temp 98.8 F (37.1 C) (Oral)  Resp 18  Ht 5\' 5"  (1.651 m)  Wt 206 lb (93.441 kg)  BMI 34.28 kg/m2  SpO2 98%  LMP 04/28/2011   FHT:  FHR: 130 bpm, variability: moderate,  accelerations:  Present,  decelerations:  Absent UC:   regular, every 3-6 minutes SVE:   Dilation: 1 Effacement (%): Thick Exam by:: Adiel Mcnamara, CNM  Foley bulb placed and taped to leg w traction, inflated w 60cc LR   Assessment / Plan: IOL for severe pre-eclampsia After d/w Dr Pennie Rushing, will place foley bulb and begin low dose pitocin, cancel cytotec  Begin mag sulfate per protocol      Fetal Wellbeing:  Category I Pain Control:  n/a    Labrisha Wuellner M 01/08/2012, 4:03 PM

## 2012-01-08 NOTE — Progress Notes (Signed)
[redacted]w[redacted]d Complains of swelling in hands and feet and face.  No headaches, blurred vision, or right upper quadrant tenderness. Beta strep, GC, Chlamydia sent. Preeclampsia discussed. [facility] for further evaluation and possible delivery. Dr. Stefano Gaul

## 2012-01-09 ENCOUNTER — Inpatient Hospital Stay (HOSPITAL_COMMUNITY): Payer: 59 | Admitting: Anesthesiology

## 2012-01-09 ENCOUNTER — Encounter (HOSPITAL_COMMUNITY): Payer: Self-pay | Admitting: *Deleted

## 2012-01-09 ENCOUNTER — Encounter (HOSPITAL_COMMUNITY): Payer: Self-pay | Admitting: Anesthesiology

## 2012-01-09 LAB — CBC
HCT: 31.8 % — ABNORMAL LOW (ref 36.0–46.0)
Hemoglobin: 10.7 g/dL — ABNORMAL LOW (ref 12.0–15.0)
Hemoglobin: 10.4 g/dL — ABNORMAL LOW (ref 12.0–15.0)
MCH: 26.4 pg (ref 26.0–34.0)
MCHC: 32.3 g/dL (ref 30.0–36.0)
MCHC: 32.7 g/dL (ref 30.0–36.0)
MCHC: 33.3 g/dL (ref 30.0–36.0)
Platelets: 195 10*3/uL (ref 150–400)
RDW: 14.4 % (ref 11.5–15.5)
RDW: 14.7 % (ref 11.5–15.5)
WBC: 15.1 10*3/uL — ABNORMAL HIGH (ref 4.0–10.5)

## 2012-01-09 LAB — COMPREHENSIVE METABOLIC PANEL
ALT: 26 U/L (ref 0–35)
AST: 24 U/L (ref 0–37)
Albumin: 2.4 g/dL — ABNORMAL LOW (ref 3.5–5.2)
Albumin: 2.3 g/dL — ABNORMAL LOW (ref 3.5–5.2)
Alkaline Phosphatase: 130 U/L — ABNORMAL HIGH (ref 39–117)
Alkaline Phosphatase: 146 U/L — ABNORMAL HIGH (ref 39–117)
BUN: 8 mg/dL (ref 6–23)
Chloride: 96 mEq/L (ref 96–112)
Potassium: 4.2 mEq/L (ref 3.5–5.1)
Potassium: 4.4 mEq/L (ref 3.5–5.1)
Sodium: 134 mEq/L — ABNORMAL LOW (ref 135–145)
Total Bilirubin: 0.4 mg/dL (ref 0.3–1.2)
Total Protein: 6.1 g/dL (ref 6.0–8.3)
Total Protein: 6.2 g/dL (ref 6.0–8.3)

## 2012-01-09 LAB — LACTATE DEHYDROGENASE: LDH: 286 U/L — ABNORMAL HIGH (ref 94–250)

## 2012-01-09 LAB — URIC ACID: Uric Acid, Serum: 5.9 mg/dL (ref 2.4–7.0)

## 2012-01-09 MED ORDER — LACTATED RINGERS IV SOLN
INTRAVENOUS | Status: DC
  Administered 2012-01-09 (×2): via INTRAUTERINE

## 2012-01-09 MED ORDER — PHENYLEPHRINE 40 MCG/ML (10ML) SYRINGE FOR IV PUSH (FOR BLOOD PRESSURE SUPPORT)
80.0000 ug | PREFILLED_SYRINGE | INTRAVENOUS | Status: DC | PRN
  Filled 2012-01-09: qty 5

## 2012-01-09 MED ORDER — LIDOCAINE HCL (PF) 1 % IJ SOLN
INTRAMUSCULAR | Status: DC | PRN
  Administered 2012-01-09 (×2): 5 mL

## 2012-01-09 MED ORDER — EPHEDRINE 5 MG/ML INJ
10.0000 mg | INTRAVENOUS | Status: DC | PRN
  Filled 2012-01-09: qty 4

## 2012-01-09 MED ORDER — BUPIVACAINE HCL (PF) 0.25 % IJ SOLN
INTRAMUSCULAR | Status: DC | PRN
  Administered 2012-01-09 (×2): 5 mL

## 2012-01-09 MED ORDER — MISOPROSTOL 200 MCG PO TABS
800.0000 ug | ORAL_TABLET | Freq: Once | ORAL | Status: AC
  Administered 2012-01-09: 800 ug via RECTAL

## 2012-01-09 MED ORDER — LACTATED RINGERS IV SOLN
500.0000 mL | Freq: Once | INTRAVENOUS | Status: AC
  Administered 2012-01-09: 1000 mL via INTRAVENOUS

## 2012-01-09 MED ORDER — MISOPROSTOL 200 MCG PO TABS
ORAL_TABLET | ORAL | Status: AC
  Filled 2012-01-09: qty 4

## 2012-01-09 MED ORDER — FENTANYL 2.5 MCG/ML BUPIVACAINE 1/10 % EPIDURAL INFUSION (WH - ANES)
14.0000 mL/h | INTRAMUSCULAR | Status: DC
  Administered 2012-01-09 (×2): 14 mL/h via EPIDURAL
  Filled 2012-01-09 (×2): qty 125

## 2012-01-09 MED ORDER — PHENYLEPHRINE 40 MCG/ML (10ML) SYRINGE FOR IV PUSH (FOR BLOOD PRESSURE SUPPORT): 80.0000 ug | PREFILLED_SYRINGE | INTRAVENOUS | Status: DC | PRN

## 2012-01-09 MED ORDER — DIPHENHYDRAMINE HCL 50 MG/ML IJ SOLN: 12.5000 mg | INTRAMUSCULAR | Status: DC | PRN

## 2012-01-09 MED ORDER — EPHEDRINE 5 MG/ML INJ: 10.0000 mg | INTRAVENOUS | Status: DC | PRN

## 2012-01-09 NOTE — Progress Notes (Signed)
Diane Craig is a 26 y.o. G1P0000 at [redacted]w[redacted]d admitted for IOL for PreEclampsia. Subjective: Pt resting in Rt tilted position.  S.o. Asleep at bedside.  Pt has had good benefit from Nubain and Phenergan which she received just before 1900.  Denies PIH s/s.  Pitocin on 8mu.  Objective: BP 143/92  Pulse 96  Temp 99 F (37.2 C) (Oral)  Resp 20  Ht 5\' 5"  (1.651 m)  Wt 206 lb (93.441 kg)  BMI 34.28 kg/m2  SpO2 98%  LMP 04/28/2011 I/O last 3 completed shifts: In: 986.9 [P.O.:360; I.V.:626.9] Out: 425 [Urine:425] Total I/O In: 625 [I.V.:625] Out: 200 [Urine:200] .Marland Kitchen Filed Vitals:   01/08/12 2231 01/08/12 2301 01/08/12 2331 01/09/12 0001  BP: 138/88 149/86 148/82 143/92  Pulse: 96 95 92 96  Temp:  99 F (37.2 C)    TempSrc:  Oral    Resp: 18 18 18 20   Height:      Weight:      SpO2:      FHT:  FHR: 135 bpm, variability: moderate,  accelerations:  Present,  decelerations:  Absent UC:   irregular, every 2-7 minutes couplet pattern SVE:   Dilation: 1 Effacement (%): Thick Exam by:: Lillard, CNM Deferred SVE--foley bulb remains in place Labs: Lab Results  Component Value Date   WBC 7.7 01/08/2012   HGB 10.3* 01/08/2012   HCT 32.0* 01/08/2012   MCV 80.8 01/08/2012   PLT 167 01/08/2012    Assessment / Plan: Induction of labor due to preeclampsia,  progressing well on pitocin  Labor: latent Preeclampsia:  on magnesium sulfate Fetal Wellbeing:  Category I Pain Control:  nubain & phenergan x1 I/D:  n/a Anticipated MOD:  NSVD 1.  CTO closely 2.  Will continue to titrate pitocin; rec'd at least hourly position changes. 3.  C/w MD prn Kidada Ging H 01/09/2012, 12:28 AM

## 2012-01-09 NOTE — Progress Notes (Signed)
Diane Craig is a 26 y.o. G1P0000 at [redacted]w[redacted]d admitted for induction of labor due to Pre-eclamptic toxemia of pregnancy..  Subjective: Pt awake and sitting up in bed.  No c/o's.  Feels mild ctxs/cramping.  S.o. Awake at bedside now as well.  Pitocin on 12mu.  Pt denies PIH s/s.  GFM.  Objective: BP 149/98  Pulse 98  Temp 99 F (37.2 C) (Oral)  Resp 18  Ht 5\' 5"  (1.651 m)  Wt 206 lb (93.441 kg)  BMI 34.28 kg/m2  SpO2 98%  LMP 04/28/2011 I/O last 3 completed shifts: In: 986.9 [P.O.:360; I.V.:626.9] Out: 425 [Urine:425] Total I/O In: 1060 [P.O.:60; I.V.:1000] Out: 375 [Urine:375] .Marland Kitchen Filed Vitals:   01/09/12 0001 01/09/12 0103 01/09/12 0202 01/09/12 0301  BP: 143/92 155/100 142/87 149/98  Pulse: 96 94 101 98  Temp:      TempSrc:      Resp: 20 20 18 18   Height:      Weight:      SpO2:       FHT:  FHR: 130 bpm, variability: moderate,  accelerations:  Present,  decelerations:  Absent UC:   irregular, every 4-6 minutes SVE:   Dilation: 1 Effacement (%): Thick Exam by:: Lillard, CNM SVE deferred Labs: Lab Results  Component Value Date   WBC 7.7 01/08/2012   HGB 10.3* 01/08/2012   HCT 32.0* 01/08/2012   MCV 80.8 01/08/2012   PLT 167 01/08/2012    Assessment / Plan: 1. [redacted]w[redacted]d 2. PreEclampsia 3. SGA  Labor: Induction in process; foley bulb remains in place Preeclampsia:  on magnesium sulfate Fetal Wellbeing:  Category I Pain Control:  Labor support without medications I/D:  n/a Anticipated MOD:  NSVD 1.  CTO closely and c/w MD prn; RN's titrating Pitocin. Rowen Hur H 01/09/2012, 3:12 AM

## 2012-01-09 NOTE — Progress Notes (Signed)
H Steelman CNM notified of increased BP and labetalol given x3.  Also updated on bleeding.  No new orders received.

## 2012-01-09 NOTE — Progress Notes (Signed)
Pain level 7, requests epidural O BP 155/100  Pulse 98  Temp 98.9 F (37.2 C) (Oral)  Resp 20  Ht 5\' 5"  (1.651 m)  Wt 206 lb (93.441 kg)  BMI 34.28 kg/m2  SpO2 98%  LMP 04/28/2011      fhts category 1      abd soft between uc      Contractions q 4-5 mild to mod      Vag unchanged A [redacted]w[redacted]d PIH induction P continue care Lavera Guise, CNM

## 2012-01-09 NOTE — Progress Notes (Signed)
Notified H Steelman of patient BP and labetalol given.  Updated on UC, FHR, and increased rectal pressure.  Steelman to come up to assess pt in person.

## 2012-01-09 NOTE — Progress Notes (Signed)
Patient ID: Diane Craig, female   DOB: [birth year], 26 y.o.   MRN: [id number]  S:  Pt c/o Lt lower back pain.  Feeling nauseated.  Foley bulb out.  Pitocin on 14mu.  No PIH s/s. BP trending up over last hour.  Pt declines pain intervention at this time O:  .Marland Kitchen Filed Vitals:   01/09/12 0401 01/09/12 0502 01/09/12 0605 01/09/12 0614  BP: 144/96 140/82 164/104 170/97  Pulse: 102 96 109 107  Temp: 99.1 F (37.3 C)     TempSrc: Oral     Resp: 18 18 18 20   Height:      Weight:      SpO2:      SVE:  5/80/-2 w/ BBOW  A:  [redacted]w[redacted]d w/ PreEclampsia; early labor  P:  RN to recheck BP at 0630, and if remains elevated following parameters, will give IV Labetalol.      Deferred AROM at present secondary to fetal station      CTO closely and continue to titrate Pitocin      C/w MD prn  C. Emet, [place] 01/09/12 [phone]

## 2012-01-09 NOTE — Anesthesia Procedure Notes (Signed)
Epidural Patient location during procedure: OB Start time: 01/09/2012 12:45 PM  Staffing Anesthesiologist: Brayton Caves R Performed by: anesthesiologist   Preanesthetic Checklist Completed: patient identified, site marked, surgical consent, pre-op evaluation, timeout performed, IV checked, risks and benefits discussed and monitors and equipment checked  Epidural Patient position: sitting Prep: site prepped and draped and DuraPrep Patient monitoring: continuous pulse ox and blood pressure Approach: midline Injection technique: LOR air and LOR saline  Needle:  Needle type: Tuohy  Needle gauge: 17 G Needle length: 9 cm and 9 Needle insertion depth: 8 cm Catheter type: closed end flexible Catheter size: 19 Gauge Catheter at skin depth: 13 cm Test dose: negative  Assessment Events: blood not aspirated, injection not painful, no injection resistance, negative IV test and no paresthesia  Additional Notes Patient identified.  Risk benefits discussed including failed block, incomplete pain control, headache, nerve damage, paralysis, blood pressure changes, nausea, vomiting, reactions to medication both toxic or allergic, and postpartum back pain.  Patient expressed understanding and wished to proceed.  All questions were answered.  Sterile technique used throughout procedure and epidural site dressed with sterile barrier dressing. No paresthesia or other complications noted.The patient did not experience any signs of intravascular injection such as tinnitus or metallic taste in mouth nor signs of intrathecal spread such as rapid motor block. Please see nursing notes for vital signs.

## 2012-01-09 NOTE — Anesthesia Preprocedure Evaluation (Signed)
Anesthesia Evaluation  Patient identified by MRN, date of birth, ID band Patient awake    Reviewed: Allergy & Precautions, H&P , Patient's Chart, lab work & pertinent test results  Airway Mallampati: II TM Distance: >3 FB Neck ROM: full    Dental No notable dental hx.    Pulmonary neg pulmonary ROS,  breath sounds clear to auscultation  Pulmonary exam normal       Cardiovascular hypertension, negative cardio ROS  Rhythm:regular Rate:Normal     Neuro/Psych negative neurological ROS  negative psych ROS   GI/Hepatic negative GI ROS, Neg liver ROS,   Endo/Other  negative endocrine ROS  Renal/GU negative Renal ROS     Musculoskeletal   Abdominal   Peds  Hematology negative hematology ROS (+)   Anesthesia Other Findings   Reproductive/Obstetrics (+) Pregnancy                           Anesthesia Physical Anesthesia Plan  ASA: III  Anesthesia Plan: Epidural   Post-op Pain Management:    Induction:   Airway Management Planned:   Additional Equipment:   Intra-op Plan:   Post-operative Plan:   Informed Consent: I have reviewed the patients History and Physical, chart, labs and discussed the procedure including the risks, benefits and alternatives for the proposed anesthesia with the patient or authorized representative who has indicated his/her understanding and acceptance.     Plan Discussed with:   Anesthesia Plan Comments:         Anesthesia Quick Evaluation  

## 2012-01-09 NOTE — Progress Notes (Signed)
Comfortable, some pressure O BP 161/100  Pulse 98  Temp 98.9 F (37.2 C) (Oral)  Resp 18  Ht 5\' 5"  (1.651 m)  Wt 206 lb (93.441 kg)  BMI 34.28 kg/m2  SpO2 98%  LMP 04/28/2011      fhts category 1      abd soft between uc      Contractions q 3-5 mild to mod      Vag 5 100 -2 VTX bulging ballotable A [redacted]w[redacted]d PIH P continue care Lavera Guise, CNM

## 2012-01-09 NOTE — Progress Notes (Signed)
CX: 5/80/-3. With pushing, Cx is -1 station. AROM-clear. IUPC and FSE applied. FHT Cat:2. Pt positioned on her side. Obs for now. Dr. Stefano Gaul

## 2012-01-09 NOTE — Progress Notes (Signed)
C/o of painful uc O BP 149/95  Pulse 89  Temp 98.7 F (37.1 C) (Oral)  Resp 20  Ht 5\' 5"  (1.651 m)  Wt 206 lb (93.441 kg)  BMI 34.28 kg/m2  SpO2 96%  LMP 04/28/2011      fhts category 1      abd soft between uc      Contractions 180 MVU      Vag 8 100 -1 clear A [redacted]w[redacted]d    Active labor P anesthesia to top up epidural, continue care Lavera Guise, CNM

## 2012-01-09 NOTE — Consult Note (Signed)
Neonatology Note:  Attendance at Delivery:  I was asked to attend this NSVD at 35 6/[redacted] weeks GA due to known IUGR and maternal PIH, on magnesium sulfate. The mother is a G1P0 B pos, GBS pending at delivery (no antenatal antibiotics) with severe PIH, s/p induction. ROM 6 hours prior to delivery, fluid clear. Infant was vigorous at birth with good spontaneous cry and tone, but by 1 minute, had quieted and had slightly decreased tone. Some periodic breathing was noted between 1-3 minutes of life and the baby was given stimulation. A pulse oximeter was placed for monitoring; the O2 saturation rose gradually to normal by 3-4 minutes. We continued to observe the baby for 10-12 minutes and noted no further irregular breathing. Needed only minimal bulb suctioning. Ap 6/9. Lungs clear to ausc in DR. Weight 2200 grams. Allowed to stay with parents for skin to skin time, then will go to CN to care of Pediatrician.  Scout Guyett, MD  

## 2012-01-09 NOTE — Progress Notes (Signed)
H Steelman CNM at bedside to evaluate pt.  Gave orders to reassess BP at 0630 and give labetalol as ordered if pressures are still outside of parameters.

## 2012-01-09 NOTE — Progress Notes (Signed)
UC hurt in front, able to sleep between, denies ha, visual spots or blurring, with +1 pitting edema lower legs. O BP 145/88  Pulse 101  Temp 99.1 F (37.3 C) (Oral)  Resp 18  Ht 5\' 5"  (1.651 m)  Wt 206 lb (93.441 kg)  BMI 34.28 kg/m2  SpO2 98%  LMP 04/28/2011      fhts category 1      abd soft between uc      Contractions q 4-5 mild to mod      Vag not assessed A [redacted]w[redacted]d PIH induction P continue care Lavera Guise, CNM

## 2012-01-09 NOTE — Progress Notes (Signed)
Diane Craig is a 26 y.o. G1P0000 at [redacted]w[redacted]d admitted for induction of labor due to Pre-eclamptic toxemia of pregnancy..  Subjective: Pt just getting labs drawn.  Ctx w/ inconsistent intensity.  Pitocin on 14mu.  No PIH s/s.   Objective: BP 140/82  Pulse 96  Temp 99.1 F (37.3 C) (Oral)  Resp 18  Ht 5\' 5"  (1.651 m)  Wt 206 lb (93.441 kg)  BMI 34.28 kg/m2  SpO2 98%  LMP 04/28/2011 I/O last 3 completed shifts: In: 986.9 [P.O.:360; I.V.:626.9] Out: 425 [Urine:425] Total I/O In: 1374.2 [P.O.:120; I.V.:1254.2] Out: 625 [Urine:625]  FHT:  FHR: 120 bpm, variability: moderate,  accelerations:  Present,  decelerations:  Absent UC:   irregular, every 2-6 minutes SVE:   Dilation: 1 Effacement (%): Thick Exam by:: Lillard, CNM Foley bulb still in place; just repositioned by RN and re-taped Labs: Lab Results  Component Value Date   WBC 7.7 01/08/2012   HGB 10.3* 01/08/2012   HCT 32.0* 01/08/2012   MCV 80.8 01/08/2012   PLT 167 01/08/2012    Assessment / Plan: 1.  [redacted]w[redacted]d 2. PreEclampsia 3. GBS neg   Labor: induction in progress Preeclampsia:  on magnesium sulfate Fetal Wellbeing:  Category I Pain Control:  Labor support without medications I/D:  n/a Anticipated MOD:  NSVD 1.  Continue current POC; c/w MD prn  Jessen Siegman H 01/09/2012, 5:23 AM

## 2012-01-10 ENCOUNTER — Encounter: Payer: Private Health Insurance - Indemnity | Admitting: Obstetrics and Gynecology

## 2012-01-10 ENCOUNTER — Other Ambulatory Visit: Payer: Private Health Insurance - Indemnity

## 2012-01-10 LAB — CBC
HCT: 30.2 % — ABNORMAL LOW (ref 36.0–46.0)
MCH: 26.3 pg (ref 26.0–34.0)
MCV: 79.5 fL (ref 78.0–100.0)
Platelets: 196 10*3/uL (ref 150–400)
RDW: 14.6 % (ref 11.5–15.5)
WBC: 17.8 10*3/uL — ABNORMAL HIGH (ref 4.0–10.5)

## 2012-01-10 LAB — COMPREHENSIVE METABOLIC PANEL
AST: 19 U/L (ref 0–37)
Albumin: 1.9 g/dL — ABNORMAL LOW (ref 3.5–5.2)
BUN: 10 mg/dL (ref 6–23)
Calcium: 7.4 mg/dL — ABNORMAL LOW (ref 8.4–10.5)
Chloride: 98 mEq/L (ref 96–112)
Creatinine, Ser: 0.94 mg/dL (ref 0.50–1.10)
GFR calc non Af Amer: 84 mL/min — ABNORMAL LOW (ref >90)
Total Bilirubin: 0.2 mg/dL — ABNORMAL LOW (ref 0.3–1.2)

## 2012-01-10 LAB — STREP B DNA PROBE: GBSP: NEGATIVE

## 2012-01-10 LAB — GC/CHLAMYDIA PROBE AMP: CT Probe RNA: NEGATIVE

## 2012-01-10 LAB — URIC ACID: Uric Acid, Serum: 6.3 mg/dL (ref 2.4–7.0)

## 2012-01-10 LAB — URINE CULTURE
Culture: NO GROWTH
Culture: NO GROWTH

## 2012-01-10 LAB — MAGNESIUM: Magnesium: 5.5 mg/dL — ABNORMAL HIGH (ref 1.5–2.5)

## 2012-01-10 LAB — LACTATE DEHYDROGENASE: LDH: 270 U/L — ABNORMAL HIGH (ref 94–250)

## 2012-01-10 MED ORDER — ACETAMINOPHEN 500 MG PO TABS
1000.0000 mg | ORAL_TABLET | Freq: Four times a day (QID) | ORAL | Status: DC | PRN
  Administered 2012-01-10: 1000 mg via ORAL
  Filled 2012-01-10: qty 2

## 2012-01-10 MED ORDER — ZOLPIDEM TARTRATE 5 MG PO TABS: 5.0000 mg | ORAL_TABLET | Freq: Every evening | ORAL | Status: DC | PRN

## 2012-01-10 MED ORDER — PRENATAL MULTIVITAMIN CH
1.0000 | ORAL_TABLET | Freq: Every day | ORAL | Status: DC
  Administered 2012-01-10 – 2012-01-12 (×3): 1 via ORAL
  Filled 2012-01-10 (×3): qty 1

## 2012-01-10 MED ORDER — DIBUCAINE 1 % RE OINT: 1.0000 "application " | TOPICAL_OINTMENT | RECTAL | Status: DC | PRN

## 2012-01-10 MED ORDER — SENNOSIDES-DOCUSATE SODIUM 8.6-50 MG PO TABS
2.0000 | ORAL_TABLET | Freq: Every day | ORAL | Status: DC
  Administered 2012-01-11 (×2): 2 via ORAL

## 2012-01-10 MED ORDER — OXYTOCIN 40 UNITS IN LACTATED RINGERS INFUSION - SIMPLE MED: 62.5000 mL/h | INTRAVENOUS | Status: AC

## 2012-01-10 MED ORDER — MEDROXYPROGESTERONE ACETATE 150 MG/ML IM SUSP: 150.0000 mg | INTRAMUSCULAR | Status: DC | PRN

## 2012-01-10 MED ORDER — ONDANSETRON HCL 4 MG PO TABS: 4.0000 mg | ORAL_TABLET | ORAL | Status: DC | PRN

## 2012-01-10 MED ORDER — OXYCODONE-ACETAMINOPHEN 5-325 MG PO TABS: 1.0000 | ORAL_TABLET | ORAL | Status: DC | PRN

## 2012-01-10 MED ORDER — MAGNESIUM HYDROXIDE 400 MG/5ML PO SUSP
30.0000 mL | ORAL | Status: DC | PRN
  Filled 2012-01-10: qty 30

## 2012-01-10 MED ORDER — LABETALOL HCL 200 MG PO TABS
200.0000 mg | ORAL_TABLET | Freq: Three times a day (TID) | ORAL | Status: DC
  Administered 2012-01-10 – 2012-01-11 (×2): 200 mg via ORAL
  Filled 2012-01-10 (×2): qty 1

## 2012-01-10 MED ORDER — BENZOCAINE-MENTHOL 20-0.5 % EX AERO: 1.0000 "application " | INHALATION_SPRAY | CUTANEOUS | Status: DC | PRN

## 2012-01-10 MED ORDER — LANOLIN HYDROUS EX OINT: TOPICAL_OINTMENT | CUTANEOUS | Status: DC | PRN

## 2012-01-10 MED ORDER — TETANUS-DIPHTH-ACELL PERTUSSIS 5-2.5-18.5 LF-MCG/0.5 IM SUSP
0.5000 mL | Freq: Once | INTRAMUSCULAR | Status: AC
  Administered 2012-01-10: 0.5 mL via INTRAMUSCULAR
  Filled 2012-01-10: qty 0.5

## 2012-01-10 MED ORDER — SIMETHICONE 80 MG PO CHEW: 80.0000 mg | CHEWABLE_TABLET | ORAL | Status: DC | PRN

## 2012-01-10 MED ORDER — DIPHENHYDRAMINE HCL 25 MG PO CAPS: 25.0000 mg | ORAL_CAPSULE | Freq: Four times a day (QID) | ORAL | Status: DC | PRN

## 2012-01-10 MED ORDER — ONDANSETRON HCL 4 MG/2ML IJ SOLN: 4.0000 mg | INTRAMUSCULAR | Status: DC | PRN

## 2012-01-10 MED ORDER — IBUPROFEN 600 MG PO TABS
600.0000 mg | ORAL_TABLET | Freq: Four times a day (QID) | ORAL | Status: DC
  Administered 2012-01-10 – 2012-01-12 (×11): 600 mg via ORAL
  Filled 2012-01-10 (×11): qty 1

## 2012-01-10 MED ORDER — WITCH HAZEL-GLYCERIN EX PADS: 1.0000 "application " | MEDICATED_PAD | CUTANEOUS | Status: DC | PRN

## 2012-01-10 MED ORDER — POLYSACCHARIDE IRON COMPLEX 150 MG PO CAPS
150.0000 mg | ORAL_CAPSULE | Freq: Two times a day (BID) | ORAL | Status: DC
  Administered 2012-01-10 – 2012-01-12 (×5): 150 mg via ORAL
  Filled 2012-01-10 (×8): qty 1

## 2012-01-10 MED ORDER — LACTATED RINGERS IV SOLN
INTRAVENOUS | Status: DC
  Administered 2012-01-10: 14:00:00 via INTRAVENOUS

## 2012-01-10 MED ORDER — MEASLES, MUMPS & RUBELLA VAC ~~LOC~~ INJ
0.5000 mL | INJECTION | Freq: Once | SUBCUTANEOUS | Status: DC
  Filled 2012-01-10: qty 0.5

## 2012-01-10 NOTE — Anesthesia Postprocedure Evaluation (Signed)
  Anesthesia Post-op Note  Patient: Diane Craig  Procedure(s) Performed: * No procedures listed *  Patient Location: PACU  Anesthesia Type:Epidural  Level of Consciousness: awake  Airway and Oxygen Therapy: Patient Spontanous Breathing  Post-op Pain: mild  Post-op Assessment: Post-op Vital signs reviewed  Post-op Vital Signs: Reviewed and stable  Complications: No apparent anesthesia complications

## 2012-01-10 NOTE — Progress Notes (Signed)
UR chart review completed.  

## 2012-01-10 NOTE — Progress Notes (Signed)
Post Partum Day1 Subjective: no complaints, voiding and tolerating PO and breast feeding.  Pt has a mild headache. No blurred vision or RUQ pain  Objective: Afebrile VSS  Physical Exam:  General: alert Lochia: appropriate Uterine Fundus: firm  DVT Evaluation: No evidence of DVT seen on physical exam.   Basename 01/10/12 0535 01/09/12 2238  HGB 10.0* 11.7*  HCT 30.2* 35.1*    Assessment/Plan: PPD 1.  Pt desires to d/c foley.  Her urine output is good. Ptbreast feeding Routine care Check labs in the morning.  Plan to stop MGSO at 10 pm   LOS: 2 days   Norlene Lanes A 01/10/2012, 1:58 PM

## 2012-01-11 DIAGNOSIS — IMO0002 Reserved for concepts with insufficient information to code with codable children: Secondary | ICD-10-CM

## 2012-01-11 LAB — CBC WITH DIFFERENTIAL/PLATELET
Basophils Absolute: 0 10*3/uL (ref 0.0–0.1)
Basophils Relative: 0 % (ref 0–1)
Eosinophils Absolute: 0.1 10*3/uL (ref 0.0–0.7)
HCT: 28.8 % — ABNORMAL LOW (ref 36.0–46.0)
Hemoglobin: 9.1 g/dL — ABNORMAL LOW (ref 12.0–15.0)
MCH: 25.6 pg — ABNORMAL LOW (ref 26.0–34.0)
MCHC: 31.6 g/dL (ref 30.0–36.0)
Monocytes Absolute: 0.9 10*3/uL (ref 0.1–1.0)
Monocytes Relative: 8 % (ref 3–12)
RDW: 14.7 % (ref 11.5–15.5)

## 2012-01-11 LAB — COMPREHENSIVE METABOLIC PANEL
Alkaline Phosphatase: 98 U/L (ref 39–117)
BUN: 12 mg/dL (ref 6–23)
Calcium: 7.6 mg/dL — ABNORMAL LOW (ref 8.4–10.5)
Creatinine, Ser: 1.05 mg/dL (ref 0.50–1.10)
GFR calc Af Amer: 85 mL/min — ABNORMAL LOW (ref >90)
Glucose, Bld: 70 mg/dL (ref 70–99)
Total Protein: 5.3 g/dL — ABNORMAL LOW (ref 6.0–8.3)

## 2012-01-11 LAB — URIC ACID: Uric Acid, Serum: 6.6 mg/dL (ref 2.4–7.0)

## 2012-01-11 LAB — RPR: RPR Ser Ql: NONREACTIVE

## 2012-01-11 MED ORDER — LABETALOL HCL 200 MG PO TABS
200.0000 mg | ORAL_TABLET | ORAL | Status: AC
  Administered 2012-01-11: 200 mg via ORAL
  Filled 2012-01-11: qty 1

## 2012-01-11 MED ORDER — LABETALOL HCL 200 MG PO TABS
400.0000 mg | ORAL_TABLET | Freq: Two times a day (BID) | ORAL | Status: DC
  Administered 2012-01-11: 400 mg via ORAL
  Filled 2012-01-11 (×2): qty 2

## 2012-01-11 MED ORDER — LABETALOL HCL 300 MG PO TABS
600.0000 mg | ORAL_TABLET | Freq: Two times a day (BID) | ORAL | Status: DC
  Filled 2012-01-11: qty 2

## 2012-01-11 NOTE — Progress Notes (Addendum)
Post Partum Day 2:S/P SVB, pre-eclampsia--Magnesium stopped 10pm last night.  Just transferred to [facility]. Subjective: Patient up ad lib, denies syncope or dizziness.  No HA, visual symptoms, or epigastric pain. Feeding:  Bottle Contraceptive plan:   Undecided Baby in Central Nursery--per peds, anticipate earliest d/c tomorrow due to prematurity and SGA status.  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 Charlett Blake.  Objective: Blood pressure 147/102, pulse 72, temperature 98.2 F (36.8 C), temperature source Oral, resp. rate 18, height 5\' 5"  (1.651 m), weight 195 lb (88.451 kg), last menstrual period 04/28/2011, SpO2 100.00%, unknown if currently breastfeeding.  Filed Vitals:   01/11/12 0513 01/11/12 0800 01/11/12 0947 01/11/12 1057  BP: 151/99 149/94 159/98 147/102  Pulse: 79 71 75 72  Temp:  98.2 F (36.8 C) 98.3 F (36.8 C) 98.2 F (36.8 C)  TempSrc:  Oral Oral Oral  Resp: 20 20 18 18   Height:      Weight: 195 lb (88.451 kg)     SpO2: 96% 94% 100% 100%   Received initial dose of Labetalol 200 mg TID last night, 2nd dose at 9:30am today.  Physical Exam:  General: alert Lochia: appropriate Uterine Fundus: firm Incision: Perineum intact DVT Evaluation: No evidence of DVT seen on physical exam. Negative Homan's sign. DTR 2+ without clonus  Results for orders placed during the hospital encounter of 01/08/12 (from the past 24 hour(s))  CBC WITH DIFFERENTIAL     Status: Abnormal   Collection Time   01/11/12  5:05 AM      Component Value Range   WBC 12.2 (*) 4.0 - 10.5 K/uL   RBC 3.56 (*) 3.87 - 5.11 MIL/uL   Hemoglobin 9.1 (*) 12.0 - 15.0 g/dL   HCT 45.4 (*) 09.8 - 11.9 %   MCV 80.9  78.0 - 100.0 fL   MCH 25.6 (*) 26.0 - 34.0 pg   MCHC 31.6  30.0 - 36.0 g/dL   RDW 14.7  82.9 - 56.2 %   Platelets 183  150 - 400 K/uL   Neutrophils Relative 70  43 - 77 %   Neutro Abs 8.6 (*) 1.7 - 7.7 K/uL   Lymphocytes Relative 21  12 - 46 %   Lymphs Abs 2.6  0.7 - 4.0 K/uL   Monocytes Relative 8  3 - 12 %   Monocytes Absolute 0.9  0.1 - 1.0 K/uL   Eosinophils Relative 1  0 - 5 %   Eosinophils Absolute 0.1  0.0 - 0.7 K/uL   Basophils Relative 0  0 - 1 %   Basophils Absolute 0.0  0.0 - 0.1 K/uL  LACTATE DEHYDROGENASE     Status: Abnormal   Collection Time   01/11/12  5:05 AM      Component Value Range   LDH 270 (*) 94 - 250 U/L  COMPREHENSIVE METABOLIC PANEL     Status: Abnormal   Collection Time   01/11/12  5:05 AM      Component Value Range   Sodium 136  135 - 145 mEq/L   Potassium 4.3  3.5 - 5.1 mEq/L   Chloride 102  96 - 112 mEq/L   CO2 27  19 - 32 mEq/L   Glucose, Bld 70  70 - 99 mg/dL   BUN 12  6 - 23 mg/dL   Creatinine, Ser 1.30  0.50 - 1.10 mg/dL   Calcium 7.6 (*) 8.4 - 10.5 mg/dL   Total Protein 5.3 (*)  6.0 - 8.3 g/dL   Albumin 2.0 (*) 3.5 - 5.2 g/dL   AST 20  0 - 37 U/L   ALT 20  0 - 35 U/L   Alkaline Phosphatase 98  39 - 117 U/L   Total Bilirubin 0.2 (*) 0.3 - 1.2 mg/dL   GFR calc non Af Amer 74 (*) >90 mL/min   GFR calc Af Amer 85 (*) >90 mL/min  URIC ACID     Status: Normal   Collection Time   01/11/12  5:05 AM      Component Value Range   Uric Acid, Serum 6.6  2.4 - 7.0 mg/dL      Basename 16/10/96 0505 01/10/12 0535  HGB 9.1* 10.0*  HCT 28.8* 30.2*    Assessment/Plan: S/P Vaginal delivery at 35 6/7 weeks, day 2 pp Pre-eclampsia--received 24 hours of magnesium Persistently elevated BP  Per consult with Dr. Su Hilt, increase Labetalol to 400 mg po BID.  Additional 200 mg dose now. Continue to monitor BP Anticipate d/c tomorrow   LOS: 3 days   LATHAM, VICKI 01/11/2012, 11:17 AM    Agree with above. AYR

## 2012-01-11 NOTE — Progress Notes (Addendum)
Discontinued pt's IV per Nigel Bridgeman, CNM.

## 2012-01-11 NOTE — Progress Notes (Signed)
BPs remain at same level, despite Labetalol 400 mg po BID.  Filed Vitals:   01/11/12 1351 01/11/12 1740 01/11/12 2045 01/11/12 2129  BP: 147/95 152/95 159/98 158/98  Pulse: 78 80 76 75  Temp: 98.5 F (36.9 C) 97.5 F (36.4 C) 98.1 F (36.7 C)   TempSrc: Oral Oral Oral   Resp: 18 20 18    Height:      Weight:      SpO2: 99% 98%     Consulted with Dr. Luna Glasgow increase Labetalol to 600 mg po BID. Additional 200 mg dose given now as supplement to Labetalol 400 mg po at 9:30pm.  Will CTO.  Nigel Bridgeman, CNM 01/11/12 10:25p

## 2012-01-12 LAB — COMPREHENSIVE METABOLIC PANEL
ALT: 31 U/L (ref 0–35)
Albumin: 2.2 g/dL — ABNORMAL LOW (ref 3.5–5.2)
Calcium: 8.4 mg/dL (ref 8.4–10.5)
GFR calc Af Amer: 85 mL/min — ABNORMAL LOW (ref >90)
Glucose, Bld: 65 mg/dL — ABNORMAL LOW (ref 70–99)
Sodium: 139 mEq/L (ref 135–145)
Total Protein: 5.2 g/dL — ABNORMAL LOW (ref 6.0–8.3)

## 2012-01-12 LAB — LACTATE DEHYDROGENASE: LDH: 269 U/L — ABNORMAL HIGH (ref 94–250)

## 2012-01-12 MED ORDER — IBUPROFEN 600 MG PO TABS: 600.0000 mg | ORAL_TABLET | Freq: Four times a day (QID) | ORAL | Status: DC

## 2012-01-12 MED ORDER — NIFEDIPINE ER 30 MG PO TB24: 30.0000 mg | ORAL_TABLET | Freq: Every day | ORAL | Status: DC

## 2012-01-12 MED ORDER — POLYSACCHARIDE IRON COMPLEX 150 MG PO CAPS: 150.0000 mg | ORAL_CAPSULE | Freq: Two times a day (BID) | ORAL | Status: DC

## 2012-01-12 MED ORDER — NIFEDIPINE ER 30 MG PO TB24
30.0000 mg | ORAL_TABLET | Freq: Every day | ORAL | Status: DC
  Administered 2012-01-12: 30 mg via ORAL
  Filled 2012-01-12 (×2): qty 1

## 2012-01-12 NOTE — Progress Notes (Signed)
Almond Lint, CNM notified of patient's B/P.  She will be by later to d/c pt.

## 2012-01-12 NOTE — Progress Notes (Signed)
Discharge instructions, reasons to call her MD, and prescriptions reviewed with patient, Diane Craig.  Instructions regarding home visit from [facility] Nurse patient given.  Patient had no questions. Patient signed discharge paperwork. Mother will stay in room tonight since her baby is still a patient.  Will continue to monitor infant.

## 2012-01-12 NOTE — Discharge Summary (Signed)
Obstetric Discharge Summary Reason for Admission: pre-eclampsia and subsequent IOL for severe pre-eclampsia  Prenatal Procedures: ultrasound Intrapartum Procedures: spontaneous vaginal delivery and epidural, mag sulfate, IV labetalol  Postpartum Procedures: mag sulfate for 24hours after delivery  Complications-Operative and Postpartum: none Hemoglobin  Date Value Range Status  01/11/2012 9.1* 12.0 - 15.0 g/dL Final     HCT  Date Value Range Status  01/11/2012 28.8* 36.0 - 46.0 % Final   Hospital course: Pt was sent over from the office on 10-29 for further evaluation of pre-eclampsia, BP's were elevated and the decision was made to admit it and induce labor, PIH labs were normal, but 3+ protein was noted on catheterized specimen. She was having a few ctx, so a foley bulb was place and low dose pitocin started. She was also started on mag sulfate per protocol. Pitocin then was increased per protocol, pt rcv'd 1 dose of IV meds. By that morning, the foley bulb had some out and she had progressed to 5cm. AROM was performed and pt rcv'd labor epidural. BP's remained stable the rest of the day. She then rapidly progressed and pushed well for SVD by H. Steelman, CNM, NICU also attended delivery, but infant was doing well and was allowed to stay w mom. She had some increased bleeding immediately after placenta delivered and cytotec PR was placed with no further bleeding issues. BP did rise significantly after delivery and pt rcv'd several doses of IV labetalol. Postpartum BP's remained slightly elevated even after increasing PO labetalol and she was switched to procardia 30mg  XL on PPD2. Pt was bottle feeding and contraception plan was nexplanon, although had planned to f/u w her primary care physician. Repeat PIH labs were WNL except a slight increase in uric acid, and mild anemia w hemoglobin at 9.1. SHe had a couple of BP elevations on day of d/c but they returned to BL after receiving procardia.  She denied any sx's of depression and did verbalize that she and sig other were coping well, regarding loss of 3mos old, recently . She was dc'd in the early evening on PPD, tho she did plan to stay w infant in room overnight.    Physical Exam:  General: alert and no distress Lochia: appropriate Uterine Fundus: firm Incision: healing well DVT Evaluation: No evidence of DVT seen on physical exam. Negative Homan's sign. Some 1+ LEE pitting edema, neg clonus, reflexes 1+   Discharge Diagnoses: Term Pregnancy-delivered and Preelampsia  Discharge Information: Date: 01/12/2012 Activity: pelvic rest Diet: routine and iron rich Medications: PNV, Ibuprofen, Iron and procardia 30mg  XL  Condition: stable Instructions: refer to practice specific booklet and instructions printed via epic , including vag delivery instructions, iron-rich foods and postpartum depression  Discharge to: home Follow-up Information    Follow up with [facility]. In 2 days. (a nurse will come to your house to check your blood prssure, f/u in 5-6wks at [facility] for a postpartum check )    Contact information:   [address] [place] [address] [phone]         Newborn Data: Live born female  Birth Weight: 4 lb 13.6 oz (2200 g) APGAR: 6, 9  Infant to stay in central nursery overnight for continued observation,  Infant can stay in room w pt   Diane Craig 01/12/2012, 3:00 PM

## 2012-01-12 NOTE — Progress Notes (Signed)
[facility] nurse notified of S. Lillard,CNM order to have N. Berke blood pressure taken on Monday morning.  Patient's address and cell number given to T. Tollison via of answering machine.

## 2012-01-15 NOTE — Progress Notes (Signed)
Post discharge chart review completed.  

## 2012-04-26 ENCOUNTER — Other Ambulatory Visit: Payer: Self-pay

## 2012-06-13 ENCOUNTER — Other Ambulatory Visit: Payer: Self-pay

## 2012-06-13 ENCOUNTER — Ambulatory Visit (INDEPENDENT_AMBULATORY_CARE_PROVIDER_SITE_OTHER): Payer: 59 | Admitting: Certified Nurse Midwife

## 2012-06-13 DIAGNOSIS — D500 Iron deficiency anemia secondary to blood loss (chronic): Secondary | ICD-10-CM

## 2012-06-13 LAB — CBC
Platelets: 271 10*3/uL (ref 150–400)
RBC: 4.65 MIL/uL (ref 3.87–5.11)
WBC: 5.2 10*3/uL (ref 4.0–10.5)

## 2012-07-31 ENCOUNTER — Ambulatory Visit (INDEPENDENT_AMBULATORY_CARE_PROVIDER_SITE_OTHER): Payer: 59 | Admitting: Obstetrics and Gynecology

## 2012-07-31 VITALS — BP 110/70 | Wt 146.0 lb

## 2012-07-31 DIAGNOSIS — Z3040 Encounter for surveillance of contraceptives, unspecified: Secondary | ICD-10-CM

## 2012-07-31 MED ORDER — MEDROXYPROGESTERONE ACETATE 150 MG/ML IM SUSP
150.0000 mg | Freq: Once | INTRAMUSCULAR | Status: AC
  Administered 2012-07-31: 150 mg via INTRAMUSCULAR

## 2012-07-31 NOTE — Progress Notes (Signed)
Well tolerated injection informed to return in 3 months for follow-up injection.

## 2012-10-21 ENCOUNTER — Ambulatory Visit (INDEPENDENT_AMBULATORY_CARE_PROVIDER_SITE_OTHER): Payer: 59 | Admitting: *Deleted

## 2012-10-21 VITALS — BP 120/70 | HR 84 | Ht 65.0 in | Wt 172.0 lb

## 2012-10-21 DIAGNOSIS — Z3040 Encounter for surveillance of contraceptives, unspecified: Secondary | ICD-10-CM

## 2012-10-21 MED ORDER — MEDROXYPROGESTERONE ACETATE 150 MG/ML IM SUSP
150.0000 mg | Freq: Once | INTRAMUSCULAR | Status: AC
  Administered 2012-10-21: 150 mg via INTRAMUSCULAR

## 2012-10-21 NOTE — Progress Notes (Signed)
Patient ID: Diane Craig, female   DOB: 16-Jul-1985, 27 y.o.   MRN: [id number] Pt arrived for Depo Provera injection.  Pt tolerated injection well in left gluteal. Last AEX - 05/14/12 Last Depo Provera Given - 07/31/12 Pt is within due dates. Pt should return between 01/06/13 and 01/20/13

## 2012-10-21 NOTE — Patient Instructions (Signed)
Please return between 01/06/13 and 01/20/13 for next Depo Provera injection.

## 2012-10-29 ENCOUNTER — Ambulatory Visit: Payer: 59

## 2012-10-31 ENCOUNTER — Ambulatory Visit: Payer: 59

## 2012-11-03 ENCOUNTER — Ambulatory Visit: Payer: 59

## 2013-01-08 ENCOUNTER — Ambulatory Visit (INDEPENDENT_AMBULATORY_CARE_PROVIDER_SITE_OTHER): Payer: 59 | Admitting: *Deleted

## 2013-01-08 VITALS — BP 100/60 | HR 82 | Resp 16 | Wt 170.0 lb

## 2013-01-08 DIAGNOSIS — Z3040 Encounter for surveillance of contraceptives, unspecified: Secondary | ICD-10-CM

## 2013-01-08 MED ORDER — MEDROXYPROGESTERONE ACETATE 150 MG/ML IM SUSP
150.0000 mg | Freq: Once | INTRAMUSCULAR | Status: AC
  Administered 2013-01-08: 150 mg via INTRAMUSCULAR

## 2013-01-08 NOTE — Progress Notes (Signed)
Depo Provera Injection given, pt  Tolerated injection well return for next Depo Provera 1/15-1/29/15.

## 2013-01-15 ENCOUNTER — Other Ambulatory Visit: Payer: Self-pay

## 2013-02-19 ENCOUNTER — Telehealth: Payer: Self-pay | Admitting: Certified Nurse Midwife

## 2013-02-19 NOTE — Telephone Encounter (Signed)
Pt is scheduled for next depo on 04/01/13. Would like to know if she can get the nexplanon procedure instead.

## 2013-02-19 NOTE — Telephone Encounter (Signed)
Patient returned call, OV to discuss nexplanon scheduled.

## 2013-02-19 NOTE — Telephone Encounter (Signed)
Last AEX with Verner Chol CNM on 05/14/12.  Will need OV with Verner Chol CNM to discuss  Nexplanon. Message left to return call to [place] at [phone].

## 2013-02-23 ENCOUNTER — Encounter: Payer: Self-pay | Admitting: Certified Nurse Midwife

## 2013-02-23 ENCOUNTER — Ambulatory Visit (INDEPENDENT_AMBULATORY_CARE_PROVIDER_SITE_OTHER): Payer: 59 | Admitting: Certified Nurse Midwife

## 2013-02-23 VITALS — BP 110/60 | HR 72 | Resp 16 | Ht 65.0 in | Wt 166.0 lb

## 2013-02-23 DIAGNOSIS — Z3009 Encounter for other general counseling and advice on contraception: Secondary | ICD-10-CM

## 2013-02-23 NOTE — Progress Notes (Signed)
27 y.o. Single  African [place] female g1 p1001 presents to discuss change in contraception.  Previous methods used include oral contraceptives (estrogen/progesterone), Depo-Provera.  Patient was not consistent in OCP use and had pregnancy. Patient currently on Depo Provera and would prefer to eliminate office visits every 3 months. Patient has read online about Nexplanon and feel it is a good choice for her. She has had no period except spotting x 1 with Depo Provera. Currently sexually active.  O:   Healthy female, WDWN Affect: Normal, orientation x 3 Last aex: 3/14 normal no health changes       A:  Change in contraception Desires Nexplanon  Plan:  Discussion of Advantages and disadvantages of Nexplanon with handout. Discussed expected bleeding profile and that if spotting occurs in the first 6 months this may continue through out use. Patient aware of possible changes. Patient instructed will need precert and schedule on 03/26/13 to 04/01/13. Patient agreeable.  Patient will advise if other questions.    Rv as above   22 minutes spent with patient with >50% of time spent in face to face counseling.

## 2013-02-24 NOTE — Progress Notes (Signed)
Reviewed personally.  M. Suzanne Loryn Haacke, MD.  

## 2013-02-26 ENCOUNTER — Telehealth: Payer: Self-pay | Admitting: Gynecology

## 2013-02-26 NOTE — Progress Notes (Signed)
Reviewed personally.  M. Suzanne Alyxandra Tenbrink, MD.  

## 2013-02-26 NOTE — Telephone Encounter (Signed)
Attempted to call the patient to schedule insertion of iud. Telephone had a fast busy signal. Did not ring at all.  Was going to offer date options of 01/06 at 11am or 01/09 at 2:45pm.

## 2013-03-26 ENCOUNTER — Ambulatory Visit (INDEPENDENT_AMBULATORY_CARE_PROVIDER_SITE_OTHER): Payer: 59 | Admitting: Certified Nurse Midwife

## 2013-03-26 VITALS — BP 120/64 | HR 64 | Resp 16 | Ht 65.0 in | Wt 167.0 lb

## 2013-03-26 DIAGNOSIS — Z3009 Encounter for other general counseling and advice on contraception: Secondary | ICD-10-CM

## 2013-03-26 DIAGNOSIS — Z30017 Encounter for initial prescription of implantable subdermal contraceptive: Secondary | ICD-10-CM

## 2013-03-26 NOTE — Progress Notes (Signed)
28 yrs African American Single female G1P0101  presents for Nexplanon insertion. LMP10/27/14.  Patient has read all information on Nexplanon and feels very comfortable with decision.      Contraception Currently on Depo Provera, due for repeat now. Questions addressed.  Consent signed.  HPI neg. Exam: Healthy female WDWN  Affect: normal orientation X 3  Procedure: Patient placed supine on exam table with her left arm   flexed at the elbow and externally rotated.The insertion site was identified on the inner side of upper arm.  Two marks were made 8-10cm above the medial epicondyle of the humerus.  The first mark for insertion and the second mark 4cm from the first. The insertion sited was cleansed with betadine solution. 2 ml of 1% Lidocaine was injected along the track of the insertion site. The Nexaplon under sterile conditions was inserted in left arm. The implant was palpated in the arm after insertion. A small adhesive bandage placed over insertion site. The patient palpated the device for monitoring of the device. No active bleeding was noted.  A pressure bandage was place over the site.  Assessment: Nexaplon Insertion Pt tolerated procedure well.  Plan:Instructions and warning signs and symptoms given  Questions addressed  Return visit 3 days  Return Visit:03/31/13

## 2013-03-27 NOTE — Progress Notes (Signed)
Reviewed personally.  M. Suzanne Azzie Thiem, MD.  

## 2013-03-31 ENCOUNTER — Ambulatory Visit (INDEPENDENT_AMBULATORY_CARE_PROVIDER_SITE_OTHER): Payer: 59 | Admitting: Certified Nurse Midwife

## 2013-03-31 ENCOUNTER — Encounter: Payer: Self-pay | Admitting: Certified Nurse Midwife

## 2013-03-31 VITALS — BP 110/64 | HR 64 | Resp 16 | Ht 65.0 in | Wt 165.0 lb

## 2013-03-31 DIAGNOSIS — Z3046 Encounter for surveillance of implantable subdermal contraceptive: Secondary | ICD-10-CM

## 2013-03-31 NOTE — Progress Notes (Signed)
28 y.o. Married [place] female [phone] here for follow up of Nexplanon inserted on 03/26/13. Patient denies pain, discomfort or problems after insertion. Followed directions with bandage for first 24 hours, without any bleeding or swelling noted. Patient is able to feel Nexplanon rod without problems. Patient started spotting 2 days after insertion, but has stopped now. Happy with choice! O: Healthy WD,WN female Affect: normal, orientation x 3  Left arm: Nexplanon palpated without difficulty, slight bruising noted, non tender,insertion site healed. No edema noted in area of insertion or surrounding areas.  A:Normal Nexplanon surveillance   P: Discussed findings of normal appearance after insertion. Patient has instructions for self monitoring. Questions addressed.   RV prn, aex

## 2013-03-31 NOTE — Progress Notes (Signed)
Reviewed personally.  M. Suzanne Wadsworth Skolnick, MD.  

## 2013-04-01 ENCOUNTER — Ambulatory Visit: Payer: 59

## 2013-05-15 ENCOUNTER — Encounter: Payer: Self-pay | Admitting: Certified Nurse Midwife

## 2013-05-15 ENCOUNTER — Ambulatory Visit (INDEPENDENT_AMBULATORY_CARE_PROVIDER_SITE_OTHER): Payer: 59 | Admitting: Certified Nurse Midwife

## 2013-05-15 VITALS — BP 118/70 | HR 88 | Resp 16 | Ht 64.25 in | Wt 167.0 lb

## 2013-05-15 DIAGNOSIS — Z01419 Encounter for gynecological examination (general) (routine) without abnormal findings: Secondary | ICD-10-CM

## 2013-05-15 DIAGNOSIS — Z0000 Encounter for general adult medical examination without abnormal findings: Secondary | ICD-10-CM

## 2013-05-15 LAB — CBC
HCT: 41.8 % (ref 36.0–46.0)
HEMOGLOBIN: 13.6 g/dL (ref 12.0–15.0)
MCH: 26 pg (ref 26.0–34.0)
MCHC: 32.5 g/dL (ref 30.0–36.0)
MCV: 79.8 fL (ref 78.0–100.0)
Platelets: 295 10*3/uL (ref 150–400)
RBC: 5.24 MIL/uL — AB (ref 3.87–5.11)
RDW: 14.6 % (ref 11.5–15.5)
WBC: 5.6 10*3/uL (ref 4.0–10.5)

## 2013-05-15 LAB — POCT URINALYSIS DIPSTICK
Bilirubin, UA: NEGATIVE
Glucose, UA: NEGATIVE
KETONES UA: NEGATIVE
Leukocytes, UA: NEGATIVE
Nitrite, UA: NEGATIVE
PH UA: 5
PROTEIN UA: NEGATIVE
RBC UA: NEGATIVE
Urobilinogen, UA: NEGATIVE

## 2013-05-15 NOTE — Progress Notes (Signed)
28 y.o. G1P0101 Single African American Fe here for annual exam.  Period only spotting now with Nexplanon, happy with choice. Desires STD screening but no concerns. Sees urgent care if needed for problems. No health issues today.   Patient's last menstrual period was 05/11/2013.          Sexually active: yes  The current method of family planning is nexplanon.    Exercising: no  exercise Smoker:  no  Health Maintenance: Pap:  05-14-12 neg History of abnormal pap 5?years ago with colpo and ?freeze MMG:  none Colonoscopy:  none BMD:   none TDaP:  2013 Labs: Poct urine-neg,  Self breast exam: not done   reports that she has never smoked. She has never used smokeless tobacco. She reports that she does not drink alcohol or use illicit drugs.  Past Medical History  Diagnosis Date  . Abnormal Pap smear 2009    COLPO  LAST PAP 03/2011  . Infection     YEAST X 1  . Preterm delivery without spontaneous labor 01/09/2012    Induction for Preeclampsia  . Depo-Provera contraceptive status     Past Surgical History  Procedure Laterality Date  . No past surgeries    . Colposcopy  2013    Current Outpatient Prescriptions  Medication Sig Dispense Refill  . etonogestrel (NEXPLANON) 68 MG IMPL implant Inject 1 each into the skin once.       No current facility-administered medications for this visit.    No family history on file.  ROS:  Pertinent items are noted in HPI.  Otherwise, a comprehensive ROS was negative.  Exam:   BP 118/70  Pulse 88  Resp 16  Ht 5' 4.25" (1.632 m)  Wt 167 lb (75.751 kg)  BMI 28.44 kg/m2  LMP 05/11/2013 Height: 5' 4.25" (163.2 cm)  Ht Readings from Last 3 Encounters:  05/15/13 5' 4.25" (1.632 m)  03/31/13 5\' 5"  (1.651 m)  03/26/13 5\' 5"  (1.651 m)    General appearance: alert, cooperative and appears stated age Head: Normocephalic, without obvious abnormality, atraumatic Neck: no adenopathy, supple, symmetrical, trachea midline and thyroid normal to  inspection and palpation and non-palpable Lungs: clear to auscultation bilaterally Breasts: normal appearance, no masses or tenderness, No nipple retraction or dimpling, No nipple discharge or bleeding, No axillary or supraclavicular adenopathy Heart: regular rate and rhythm Abdomen: soft, non-tender; no masses,  no organomegaly Extremities: extremities normal, atraumatic, no cyanosis or edema Nexplanon palpated in left arm intact Skin: Skin color, texture, turgor normal. No rashes or lesions Lymph nodes: Cervical, supraclavicular, and axillary nodes normal. No abnormal inguinal nodes palpated Neurologic: Grossly normal   Pelvic: External genitalia:  no lesions              Urethra:  normal appearing urethra with no masses, tenderness or lesions              Bartholin's and Skene's: normal                 Vagina: normal appearing vagina with normal color and discharge, no lesions              Cervix: normal, non tender              Pap taken: yes Bimanual Exam:  Uterus:  normal size, contour, position, consistency, mobility, non-tender and anteverted              Adnexa: normal adnexa and no mass, fullness, tenderness  Rectovaginal: Confirms               Anus:  normal sphincter tone, no lesions  A:  Well Woman with normal exam  Contraception Nexplanon  STD screening  History of abnormal pap with colpo and ?freeze 2010  P:   Reviewed health and wellness pertinent to exam  Removal due 2018  Lab: HIV,RPR,GC,Chlamydia, CBC  Pap smear as per guidelines   pap smear taken today with reflex  counseled on breast self exam, STD prevention, adequate intake of calcium and vitamin D, diet and exercise  return annually or prn  An After Visit Summary was printed and given to the patient.

## 2013-05-15 NOTE — Patient Instructions (Signed)
General topics  Next pap or exam is  due in 1 year Take a Women's multivitamin Take 1200 mg. of calcium daily - prefer dietary If any concerns in interim to call back  Breast Self-Awareness Practicing breast self-awareness may pick up problems early, prevent significant medical complications, and possibly save your life. By practicing breast self-awareness, you can become familiar with how your breasts look and feel and if your breasts are changing. This allows you to notice changes early. It can also offer you some reassurance that your breast health is good. One way to learn what is normal for your breasts and whether your breasts are changing is to do a breast self-exam. If you find a lump or something that was not present in the past, it is best to contact your caregiver right away. Other findings that should be evaluated by your caregiver include nipple discharge, especially if it is bloody; skin changes or reddening; areas where the skin seems to be pulled in (retracted); or new lumps and bumps. Breast pain is seldom associated with cancer (malignancy), but should also be evaluated by a caregiver. BREAST SELF-EXAM The best time to examine your breasts is 5 7 days after your menstrual period is over.  ExitCare Patient Information 2013 [employer].   Exercise to Stay Healthy Exercise helps you become and stay healthy. EXERCISE IDEAS AND TIPS Choose exercises that:  You enjoy.  Fit into your day. You do not need to exercise really hard to be healthy. You can do exercises at a slow or medium level and stay healthy. You can:  Stretch before and after working out.  Try yoga, Pilates, or tai chi.  Lift weights.  Walk fast, swim, jog, run, climb stairs, bicycle, dance, or rollerskate.  Take aerobic classes. Exercises that burn about 150 calories:  Running 1  miles in 15 minutes.  Playing volleyball for 45 to 60 minutes.  Washing and waxing a car for 45 to 60  minutes.  Playing touch football for 45 minutes.  Walking 1  miles in 35 minutes.  Pushing a stroller 1  miles in 30 minutes.  Playing basketball for 30 minutes.  Raking leaves for 30 minutes.  Bicycling 5 miles in 30 minutes.  Walking 2 miles in 30 minutes.  Dancing for 30 minutes.  Shoveling snow for 15 minutes.  Swimming laps for 20 minutes.  Walking up stairs for 15 minutes.  Bicycling 4 miles in 15 minutes.  Gardening for 30 to 45 minutes.  Jumping rope for 15 minutes.  Washing windows or floors for 45 to 60 minutes. Document Released: 03/31/2010 Document Revised: 05/21/2011 Document Reviewed: 03/31/2010 ExitCare Patient Information 2013 [employer].   Other topics ( that may be useful information):    Sexually Transmitted Disease Sexually transmitted disease (STD) refers to any infection that is passed from person to person during sexual activity. This may happen by way of saliva, semen, blood, vaginal mucus, or urine. Common STDs include:  Gonorrhea.  Chlamydia.  Syphilis.  HIV/AIDS.  Genital herpes.  Hepatitis B and C.  Trichomonas.  Human papillomavirus (HPV).  Pubic lice. CAUSES  An STD may be spread by bacteria, virus, or parasite. A person can get an STD by:  Sexual intercourse with an infected person.  Sharing sex toys with an infected person.  Sharing needles with an infected person.  Having intimate contact with the genitals, mouth, or rectal areas of an infected person. SYMPTOMS  Some people may not have any symptoms, but   they can still pass the infection to others. Different STDs have different symptoms. Symptoms include:  Painful or bloody urination.  Pain in the pelvis, abdomen, vagina, anus, throat, or eyes.  Skin rash, itching, irritation, growths, or sores (lesions). These usually occur in the genital or anal area.  Abnormal vaginal discharge.  Penile discharge in men.  Soft, flesh-colored skin growths in the  genital or anal area.  Fever.  Pain or bleeding during sexual intercourse.  Swollen glands in the groin area.  Yellow skin and eyes (jaundice). This is seen with hepatitis. DIAGNOSIS  To make a diagnosis, your caregiver may:  Take a medical history.  Perform a physical exam.  Take a specimen (culture) to be examined.  Examine a sample of discharge under a microscope.  Perform blood test TREATMENT   Chlamydia, gonorrhea, trichomonas, and syphilis can be cured with antibiotic medicine.  Genital herpes, hepatitis, and HIV can be treated, but not cured, with prescribed medicines. The medicines will lessen the symptoms.  Genital warts from HPV can be treated with medicine or by freezing, burning (electrocautery), or surgery. Warts may come back.  HPV is a virus and cannot be cured with medicine or surgery.However, abnormal areas may be followed very closely by your caregiver and may be removed from the cervix, vagina, or vulva through office procedures or surgery. If your diagnosis is confirmed, your recent sexual partners need treatment. This is true even if they are symptom-free or have a negative culture or evaluation. They should not have sex until their caregiver says it is okay. HOME CARE INSTRUCTIONS  All sexual partners should be informed, tested, and treated for all STDs.  Take your antibiotics as directed. Finish them even if you start to feel better.  Only take over-the-counter or prescription medicines for pain, discomfort, or fever as directed by your caregiver.  Rest.  Eat a balanced diet and drink enough fluids to keep your urine clear or pale yellow.  Do not have sex until treatment is completed and you have followed up with your caregiver. STDs should be checked after treatment.  Keep all follow-up appointments, Pap tests, and blood tests as directed by your caregiver.  Only use latex condoms and water-soluble lubricants during sexual activity. Do not use  petroleum jelly or oils.  Avoid alcohol and illegal drugs.  Get vaccinated for HPV and hepatitis. If you have not received these vaccines in the past, talk to your caregiver about whether one or both might be right for you.  Avoid risky sex practices that can break the skin. The only way to avoid getting an STD is to avoid all sexual activity.Latex condoms and dental dams (for oral sex) will help lessen the risk of getting an STD, but will not completely eliminate the risk. SEEK MEDICAL CARE IF:   You have a fever.  You have any new or worsening symptoms. Document Released: 05/19/2002 Document Revised: 05/21/2011 Document Reviewed: 05/26/2010 [facility] Patient Information 2013 [employer].    Domestic Abuse You are being battered or abused if someone close to you hits, pushes, or physically hurts you in any way. You also are being abused if you are forced into activities. You are being sexually abused if you are forced to have sexual contact of any kind. You are being emotionally abused if you are made to feel worthless or if you are constantly threatened. It is important to remember that help is available. No one has the right to abuse you. PREVENTION OF FURTHER  ABUSE  Learn the warning signs of danger. This varies with situations but may include: the use of alcohol, threats, isolation from friends and family, or forced sexual contact. Leave if you feel that violence is going to occur.  If you are attacked or beaten, report it to the police so the abuse is documented. You do not have to press charges. The police can protect you while you or the attackers are leaving. Get the officer's name and badge number and a copy of the report.  Find someone you can trust and tell them what is happening to you: your caregiver, a nurse, clergy member, close friend or family member. Feeling ashamed is natural, but remember that you have done nothing wrong. No one deserves abuse. Document Released:  02/24/2000 Document Revised: 05/21/2011 Document Reviewed: 05/04/2010 ExitCare Patient Information 2013 [employer].    How Much is Too Much Alcohol? Drinking too much alcohol can cause injury, accidents, and health problems. These types of problems can include:   Car crashes.  Falls.  Family fighting (domestic violence).  Drowning.  Fights.  Injuries.  Burns.  Damage to certain organs.  Having a baby with birth defects. ONE DRINK CAN BE TOO MUCH WHEN YOU ARE:  Working.  Pregnant or breastfeeding.  Taking medicines. Ask your doctor.  Driving or planning to drive. If you or someone you know has a drinking problem, get help from a doctor.  Document Released: 12/23/2008 Document Revised: 05/21/2011 Document Reviewed: 12/23/2008 ExitCare Patient Information 2013 [employer].   Smoking Hazards Smoking cigarettes is extremely bad for your health. Tobacco smoke has over 200 known poisons in it. There are over 60 chemicals in tobacco smoke that cause cancer. Some of the chemicals found in cigarette smoke include:   Cyanide.  Benzene.  Formaldehyde.  Methanol (wood alcohol).  Acetylene (fuel used in welding torches).  Ammonia. Cigarette smoke also contains the poisonous gases nitrogen oxide and carbon monoxide.  Cigarette smokers have an increased risk of many serious medical problems and Smoking causes approximately:  90% of all lung cancer deaths in men.  80% of all lung cancer deaths in women.  90% of deaths from chronic obstructive lung disease. Compared with nonsmokers, smoking increases the risk of:  Coronary heart disease by 2 to 4 times.  Stroke by 2 to 4 times.  Men developing lung cancer by 23 times.  Women developing lung cancer by 13 times.  Dying from chronic obstructive lung diseases by 12 times.  . Smoking is the most preventable cause of death and disease in our society.  WHY IS SMOKING ADDICTIVE?  Nicotine is the chemical  agent in tobacco that is capable of causing addiction or dependence.  When you smoke and inhale, nicotine is absorbed rapidly into the bloodstream through your lungs. Nicotine absorbed through the lungs is capable of creating a powerful addiction. Both inhaled and non-inhaled nicotine may be addictive.  Addiction studies of cigarettes and spit tobacco show that addiction to nicotine occurs mainly during the teen years, when young people begin using tobacco products. WHAT ARE THE BENEFITS OF QUITTING?  There are many health benefits to quitting smoking.   Likelihood of developing cancer and heart disease decreases. Health improvements are seen almost immediately.  Blood pressure, pulse rate, and breathing patterns start returning to normal soon after quitting. QUITTING SMOKING   American Lung Association - 1-800-LUNGUSA  American Cancer Society - 1-800-ACS-2345 Document Released: 04/05/2004 Document Revised: 05/21/2011 Document Reviewed: 12/08/2008 ExitCare Patient Information 2013 ExitCare[employer]   LLC.   Stress Management Stress is a state of physical or mental tension that often results from changes in your life or normal routine. Some common causes of stress are:  Death of a loved one.  Injuries or severe illnesses.  Getting fired or changing jobs.  Moving into a new home. Other causes may be:  Sexual problems.  Business or financial losses.  Taking on a large debt.  Regular conflict with someone at home or at work.  Constant tiredness from lack of sleep. It is not just bad things that are stressful. It may be stressful to:  Win the lottery.  Get married.  Buy a new car. The amount of stress that can be easily tolerated varies from person to person. Changes generally cause stress, regardless of the types of change. Too much stress can affect your health. It may lead to physical or emotional problems. Too little stress (boredom) may also become stressful. SUGGESTIONS TO  REDUCE STRESS:  Talk things over with your family and friends. It often is helpful to share your concerns and worries. If you feel your problem is serious, you may want to get help from a professional counselor.  Consider your problems one at a time instead of lumping them all together. Trying to take care of everything at once may seem impossible. List all the things you need to do and then start with the most important one. Set a goal to accomplish 2 or 3 things each day. If you expect to do too many in a single day you will naturally fail, causing you to feel even more stressed.  Do not use alcohol or drugs to relieve stress. Although you may feel better for a short time, they do not remove the problems that caused the stress. They can also be habit forming.  Exercise regularly - at least 3 times per week. Physical exercise can help to relieve that "uptight" feeling and will relax you.  The shortest distance between despair and hope is often a good night's sleep.  Go to bed and get up on time allowing yourself time for appointments without being rushed.  Take a short "time-out" period from any stressful situation that occurs during the day. Close your eyes and take some deep breaths. Starting with the muscles in your face, tense them, hold it for a few seconds, then relax. Repeat this with the muscles in your neck, shoulders, hand, stomach, back and legs.  Take good care of yourself. Eat a balanced diet and get plenty of rest.  Schedule time for having fun. Take a break from your daily routine to relax. HOME CARE INSTRUCTIONS   Call if you feel overwhelmed by your problems and feel you can no longer manage them on your own.  Return immediately if you feel like hurting yourself or someone else. Document Released: 08/22/2000 Document Revised: 05/21/2011 Document Reviewed: 04/14/2007 ExitCare Patient Information 2013 [employer].   

## 2013-05-16 LAB — HIV ANTIBODY (ROUTINE TESTING W REFLEX): HIV: NONREACTIVE

## 2013-05-16 LAB — RPR

## 2013-05-18 LAB — HEMOGLOBIN, FINGERSTICK: HEMOGLOBIN, FINGERSTICK: 13.1 g/dL (ref 12.0–16.0)

## 2013-05-18 NOTE — Progress Notes (Signed)
Reviewed personally.  M. Suzanne Shaka Cardin, MD.  

## 2013-05-19 LAB — IPS N GONORRHOEA AND CHLAMYDIA BY PCR

## 2013-05-19 LAB — IPS PAP TEST WITH REFLEX TO HPV

## 2013-05-21 ENCOUNTER — Other Ambulatory Visit: Payer: Self-pay | Admitting: Certified Nurse Midwife

## 2013-05-21 DIAGNOSIS — R6889 Other general symptoms and signs: Secondary | ICD-10-CM

## 2013-06-18 ENCOUNTER — Other Ambulatory Visit (INDEPENDENT_AMBULATORY_CARE_PROVIDER_SITE_OTHER): Payer: 59

## 2013-06-18 DIAGNOSIS — R6889 Other general symptoms and signs: Secondary | ICD-10-CM

## 2013-06-18 LAB — CBC
HCT: 37.4 % (ref 36.0–46.0)
HEMOGLOBIN: 12.2 g/dL (ref 12.0–15.0)
MCH: 25.4 pg — ABNORMAL LOW (ref 26.0–34.0)
MCHC: 32.6 g/dL (ref 30.0–36.0)
MCV: 77.8 fL — AB (ref 78.0–100.0)
PLATELETS: 311 10*3/uL (ref 150–400)
RBC: 4.81 MIL/uL (ref 3.87–5.11)
RDW: 14.3 % (ref 11.5–15.5)
WBC: 5.1 10*3/uL (ref 4.0–10.5)

## 2013-12-09 ENCOUNTER — Telehealth: Payer: Self-pay | Admitting: Certified Nurse Midwife

## 2013-12-09 NOTE — Telephone Encounter (Signed)
Pt states she has been having really bad cramps for the last month and half. Pt states she doesn't have a cycle.

## 2013-12-09 NOTE — Telephone Encounter (Signed)
Spoke with patient. Patient states that for the last month and a half she has had intermittent cramping that feels like "labor pains." States that pain "Has me laying on the floor in a ball and I have thought of calling the ambulance it gets so bad." States that pain lasts around 10 minutes. Denies any changes in bowel movements. Patient does not have cycle with nexplanon. Patient is not in current pain. Advised patient will need to be seen for evaluation. Offered appointment tomorrow btt patient declines. Requests morning appointment for Friday. Appointment scheduled for Friday 12/11/13 at 9:15am with Verner Choleborah S. [name] CNM. Patient agreeable to date and time.  Routing to provider for final review. Patient agreeable to disposition. Will close encounter

## 2013-12-09 NOTE — Telephone Encounter (Signed)
Left message to call Kaitlyn at [phone]. 

## 2013-12-11 ENCOUNTER — Ambulatory Visit (INDEPENDENT_AMBULATORY_CARE_PROVIDER_SITE_OTHER): Payer: 59 | Admitting: Certified Nurse Midwife

## 2013-12-11 ENCOUNTER — Encounter: Payer: Self-pay | Admitting: Certified Nurse Midwife

## 2013-12-11 VITALS — BP 92/60 | HR 88 | Temp 99.2°F | Resp 18 | Ht 64.25 in | Wt 173.0 lb

## 2013-12-11 DIAGNOSIS — N390 Urinary tract infection, site not specified: Secondary | ICD-10-CM

## 2013-12-11 DIAGNOSIS — R109 Unspecified abdominal pain: Secondary | ICD-10-CM

## 2013-12-11 LAB — POCT URINALYSIS DIPSTICK
BILIRUBIN UA: NEGATIVE
GLUCOSE UA: NEGATIVE
Ketones, UA: NEGATIVE
Nitrite, UA: NEGATIVE
Protein, UA: NEGATIVE
Urobilinogen, UA: NEGATIVE
pH, UA: 6

## 2013-12-11 NOTE — Progress Notes (Signed)
28 y.o. Single african american female   G1P0101 here for complaint of ?/abdominal pelvic pain.  Pain first occurrence Mid August,which lasted for 15 minutes, severe, no nausea, vomiting or diarrhea or constipation when pain has occurred. Patient had another occurrence Mid September and then 2 days ago.Each incident approximately 15 minute duration. This time patient felt hot and needed to lay down then resolved. Denies gastric issues afterwards, but felt like she may have had some prior to episodes. No menses since Nexplanon insertion in 1/15. Does not feel like menstrual cramping.   Pain is primarily located upper to mid quadrant of abdomen. Patient feels fatigued afterwards, but no problems. No history of kidney stones or gallbladder issues. No urinary symptoms . Patient has to stay out of work when occurs. Pain is not aggravated by anything just occurs. No partner change or STD concerns or testing needed. No fever or chills when occurs. No other health concerns today.    ROS:  Per HPI Exam:   BP 92/60  Pulse 88  Temp(Src) 99.2 F (37.3 C) (Oral)  Resp 18  Ht 5' 4.25" (1.632 m)  Wt 173 lb (78.472 kg)  BMI 29.46 kg/m2 General appearance: alert, cooperative, appears stated age and no distress Skin : warm and dry CV:  normal Lungs:clear, CVAT negative bilateral Abdomen:  soft, non-tender; bowel sounds normal; no masses,  no organomegaly no rebound or point of pain noted Nexplanon palpated intact in left arm Lymph:  Inguinal lymph nodes non tender, not enlarged Pelvic: External genitalia:  no lesions and normal              Urethra: normal appearing urethra with no masses, tenderness or lesions  Bladder, urethral meatus, non tender              Bartholins and Skenes: Bartholin's, Urethra, Skene's normal                 Vagina: normal appearing vagina with normal color and discharge, no lesions              Cervix: normal appearance and non tender,no lesions              Pap taken:  No. Bimanual Exam:  Uterus:  uterus is normal size, shape, consistency and nontender                               Adnexa:    normal adnexa in size, nontender and no masses                               Rectovaginal: Confirms  POCT Urine; trace rbc,wbc   A: Normal pelvic and abdominal exam R/O UTI  P:Discussed with patient normal exam and feel not pelvic related. ? GI related  Labs:  CBC with diff Urine culture, micro CMP    Instructions given to seek ER care if occurs again, but feel patient should be seen by GI. Patient agreeable.       RV prn.

## 2013-12-11 NOTE — Patient Instructions (Signed)

## 2013-12-12 LAB — CBC WITH DIFFERENTIAL/PLATELET
Basophils Absolute: 0 10*3/uL (ref 0.0–0.1)
Basophils Relative: 0 % (ref 0–1)
EOS ABS: 0.1 10*3/uL (ref 0.0–0.7)
EOS PCT: 1 % (ref 0–5)
HCT: 40 % (ref 36.0–46.0)
HEMOGLOBIN: 13.1 g/dL (ref 12.0–15.0)
LYMPHS ABS: 1.6 10*3/uL (ref 0.7–4.0)
Lymphocytes Relative: 21 % (ref 12–46)
MCH: 25.7 pg — AB (ref 26.0–34.0)
MCHC: 32.8 g/dL (ref 30.0–36.0)
MCV: 78.4 fL (ref 78.0–100.0)
MONO ABS: 0.6 10*3/uL (ref 0.1–1.0)
MONOS PCT: 8 % (ref 3–12)
Neutro Abs: 5.3 10*3/uL (ref 1.7–7.7)
Neutrophils Relative %: 70 % (ref 43–77)
Platelets: 291 10*3/uL (ref 150–400)
RBC: 5.1 MIL/uL (ref 3.87–5.11)
RDW: 13.9 % (ref 11.5–15.5)
WBC: 7.5 10*3/uL (ref 4.0–10.5)

## 2013-12-12 LAB — COMPREHENSIVE METABOLIC PANEL
ALT: 18 U/L (ref 0–35)
AST: 16 U/L (ref 0–37)
Albumin: 4.4 g/dL (ref 3.5–5.2)
Alkaline Phosphatase: 58 U/L (ref 39–117)
BILIRUBIN TOTAL: 0.4 mg/dL (ref 0.2–1.2)
BUN: 10 mg/dL (ref 6–23)
CO2: 27 meq/L (ref 19–32)
CREATININE: 0.9 mg/dL (ref 0.50–1.10)
Calcium: 9.4 mg/dL (ref 8.4–10.5)
Chloride: 101 mEq/L (ref 96–112)
GLUCOSE: 80 mg/dL (ref 70–99)
Potassium: 3.9 mEq/L (ref 3.5–5.3)
Sodium: 137 mEq/L (ref 135–145)
TOTAL PROTEIN: 7.2 g/dL (ref 6.0–8.3)

## 2013-12-12 LAB — URINALYSIS, MICROSCOPIC ONLY
Casts: NONE SEEN
Crystals: NONE SEEN

## 2013-12-13 LAB — URINE CULTURE
Colony Count: NO GROWTH
ORGANISM ID, BACTERIA: NO GROWTH

## 2013-12-13 NOTE — Progress Notes (Signed)
Encounter reviewed by Dr. Brook Silva.  

## 2013-12-29 ENCOUNTER — Telehealth: Payer: Self-pay | Admitting: Certified Nurse Midwife

## 2013-12-29 DIAGNOSIS — R102 Pelvic and perineal pain: Secondary | ICD-10-CM

## 2013-12-29 NOTE — Telephone Encounter (Signed)
Patient calling re: has been waiting to hear about a referral to a specialist for "severe cramps."

## 2013-12-29 NOTE — Telephone Encounter (Signed)
Patient was going to make her own appointment if continued. Ok To refer to Dr. Loreta AveMann

## 2013-12-29 NOTE — Telephone Encounter (Signed)
Message left to return call to Diane Craig at [phone].    

## 2013-12-29 NOTE — Telephone Encounter (Signed)
Order placed for referral for GI, Dr. Loreta AveMann. Patient does not have a GI doctor. Patient advised would be contacted with appointment.  Routing to [employer] [facility]abrina Franklin for referral,.  Will close encounter.

## 2013-12-29 NOTE — Telephone Encounter (Addendum)
Debbi can you review and advise? Does patient need GI Referral? There is no GI referral in EPIC.

## 2014-01-11 ENCOUNTER — Encounter: Payer: Self-pay | Admitting: Certified Nurse Midwife

## 2014-03-10 ENCOUNTER — Encounter (HOSPITAL_COMMUNITY): Payer: Self-pay | Admitting: Emergency Medicine

## 2014-03-10 ENCOUNTER — Emergency Department (HOSPITAL_COMMUNITY)
Admission: EM | Admit: 2014-03-10 | Discharge: 2014-03-10 | Disposition: A | Discharge status: Home / Self Care | Payer: 59 | Attending: Emergency Medicine | Admitting: Emergency Medicine

## 2014-03-10 ENCOUNTER — Emergency Department (HOSPITAL_COMMUNITY): Payer: 59

## 2014-03-10 DIAGNOSIS — Z8619 Personal history of other infectious and parasitic diseases: Secondary | ICD-10-CM | POA: Diagnosis not present

## 2014-03-10 DIAGNOSIS — R0602 Shortness of breath: Secondary | ICD-10-CM

## 2014-03-10 DIAGNOSIS — R062 Wheezing: Secondary | ICD-10-CM | POA: Insufficient documentation

## 2014-03-10 DIAGNOSIS — R2240 Localized swelling, mass and lump, unspecified lower limb: Secondary | ICD-10-CM | POA: Insufficient documentation

## 2014-03-10 LAB — BASIC METABOLIC PANEL
Anion gap: 7 (ref 5–15)
BUN: 11 mg/dL (ref 6–23)
CO2: 25 mmol/L (ref 19–32)
Calcium: 9.3 mg/dL (ref 8.4–10.5)
Chloride: 105 mEq/L (ref 96–112)
Creatinine, Ser: 1.04 mg/dL (ref 0.50–1.10)
GFR calc Af Amer: 85 mL/min — ABNORMAL LOW (ref >90)
GFR, EST NON AFRICAN AMERICAN: 73 mL/min — AB (ref >90)
GLUCOSE: 100 mg/dL — AB (ref 70–99)
POTASSIUM: 3.7 mmol/L (ref 3.5–5.1)
SODIUM: 137 mmol/L (ref 135–145)

## 2014-03-10 LAB — CBC WITH DIFFERENTIAL/PLATELET
Basophils Absolute: 0 10*3/uL (ref 0.0–0.1)
Basophils Relative: 0 % (ref 0–1)
EOS PCT: 2 % (ref 0–5)
Eosinophils Absolute: 0.1 10*3/uL (ref 0.0–0.7)
HCT: 37.7 % (ref 36.0–46.0)
Hemoglobin: 12.3 g/dL (ref 12.0–15.0)
LYMPHS ABS: 2.5 10*3/uL (ref 0.7–4.0)
LYMPHS PCT: 42 % (ref 12–46)
MCH: 26.1 pg (ref 26.0–34.0)
MCHC: 32.6 g/dL (ref 30.0–36.0)
MCV: 79.9 fL (ref 78.0–100.0)
Monocytes Absolute: 0.5 10*3/uL (ref 0.1–1.0)
Monocytes Relative: 9 % (ref 3–12)
NEUTROS ABS: 2.8 10*3/uL (ref 1.7–7.7)
Neutrophils Relative %: 47 % (ref 43–77)
PLATELETS: 258 10*3/uL (ref 150–400)
RBC: 4.72 MIL/uL (ref 3.87–5.11)
RDW: 13.2 % (ref 11.5–15.5)
WBC: 5.9 10*3/uL (ref 4.0–10.5)

## 2014-03-10 LAB — HCG, SERUM, QUALITATIVE: PREG SERUM: NEGATIVE

## 2014-03-10 LAB — D-DIMER, QUANTITATIVE (NOT AT ARMC)

## 2014-03-10 MED ORDER — ALBUTEROL SULFATE HFA 108 (90 BASE) MCG/ACT IN AERS
2.0000 | INHALATION_SPRAY | RESPIRATORY_TRACT | Status: DC | PRN
  Administered 2014-03-10: 2 via RESPIRATORY_TRACT
  Filled 2014-03-10: qty 6.7

## 2014-03-10 MED ORDER — ALBUTEROL SULFATE (2.5 MG/3ML) 0.083% IN NEBU
5.0000 mg | INHALATION_SOLUTION | Freq: Once | RESPIRATORY_TRACT | Status: AC
  Administered 2014-03-10: 5 mg via RESPIRATORY_TRACT
  Filled 2014-03-10: qty 6

## 2014-03-10 NOTE — ED Provider Notes (Signed)
CSN: [id number]     Arrival date & time 03/10/14  0315 History  This chart was scribe for Geoffery Lyonsouglas Anaija Wissink, MD by Angelene GiovanniEmmanuella Mensah, ED Scribe. The patient was seen in room B19C/B19C and the patient's care was started at 3:33 AM.    Chief Complaint  Patient presents with  . Shortness of Breath   The history is provided by the patient. No language interpreter was used.   HPI Comments: Diane Craig is a 28 y.o. female who presents to the Emergency Department complaining of intermittent SOB onset 2 weeks. She reports that yesterday her symptoms worsened especially after she ate. She denies cough and swelling of her legs. She also denies any hx of asthma or previous similar symptoms. She reports that she does not smoke and she is currently on birth control via the Nexplanon implant.    Past Medical History  Diagnosis Date  . Abnormal Pap smear 2009    COLPO  LAST PAP 03/2011  . Infection     YEAST X 1  . Preterm delivery without spontaneous labor 01/09/2012    Induction for Preeclampsia  . Depo-Provera contraceptive status    Past Surgical History  Procedure Laterality Date  . No past surgeries    . Colposcopy  2013   History reviewed. No pertinent family history. History  Substance Use Topics  . Smoking status: Never Smoker   . Smokeless tobacco: Never Used  . Alcohol Use: No   OB History    Gravida Para Term Preterm AB TAB SAB Ectopic Multiple Living   1 1 0 1 0 0 0 0 0 1      Review of Systems  Respiratory: Positive for shortness of breath. Negative for cough.   Cardiovascular: Positive for leg swelling.  All other systems reviewed and are negative.  A complete 10 system review of systems was obtained and all systems are negative except as noted in the HPI and PMH.     Allergies  Review of patient's allergies indicates no known allergies.  Home Medications   Prior to Admission medications   Medication Sig Start Date End Date Taking? Authorizing Provider  etonogestrel  (NEXPLANON) 68 MG IMPL implant Inject 1 each into the skin once.    Historical Provider, MD   BP 105/72 mmHg  Pulse 73  Temp(Src) 98.2 F (36.8 C) (Oral)  Resp 18  Ht 5\' 5"  (1.651 m)  Wt 180 lb (81.647 kg)  BMI 29.95 kg/m2  SpO2 100% Physical Exam  Constitutional: She is oriented to person, place, and time. She appears well-developed and well-nourished. No distress.  HENT:  Head: Normocephalic and atraumatic.  Eyes: Conjunctivae and EOM are normal.  Neck: Neck supple. No tracheal deviation present.  Cardiovascular: Normal rate.   Pulmonary/Chest: Effort normal. No respiratory distress.  Slight expiratory wheezes bilaterally.  Musculoskeletal: Normal range of motion. She exhibits no edema.  No calf tenderness. Homan sign is absent bilaterally.   Neurological: She is alert and oriented to person, place, and time.  Skin: Skin is warm and dry.  Psychiatric: She has a normal mood and affect. Her behavior is normal.  Nursing note and vitals reviewed.   ED Course  Procedures (including critical care time) DIAGNOSTIC STUDIES: Oxygen Saturation is 100% on RA, normal by my interpretation.    COORDINATION OF CARE: 3:39 AM- Pt advised of plan for treatment and pt agrees.    Labs Review Labs Reviewed - No data to display  Imaging Review No results found.  EKG Interpretation   Date/Time:  Wednesday March 10 2014 03:22:43 EST Ventricular Rate:  78 PR Interval:  196 QRS Duration: 74 QT Interval:  363 QTC Calculation: 413 R Axis:   70 Text Interpretation:  Sinus rhythm Normal ECG Confirmed by DELOS  MD,  Elienai Gailey ([id number]) on 03/10/2014 4:36:56 AM      MDM   Final diagnoses:  None    Patient presents with complaints of dyspnea over the past 2 weeks, worse over the past few days. Upon arrival, her vital signs are stable. There is no tachypnea and her oxygen saturations are 100%. She has slight wheezes, however is in no respiratory distress. Workup reveals a negative  d-dimer and I feel this adequately excludes pulmonary embolism. Chest x-ray does not show pneumonia or pneumothorax. I am unable to find an emergent cause of this. She is feeling better with an albuterol treatment and will treat her with an albuterol MDI. I suspect she may have some underlying reactive airway disease as this seemed to improve her symptoms.  I personally performed the services described in this documentation, which was scribed in my presence. The recorded information has been reviewed and is accurate.    Geoffery Lyonsouglas Taji Sather, MD 03/10/14 [phone]

## 2014-03-10 NOTE — ED Notes (Signed)
Pt c/o SOB x 2 weeks.Pt states the SOB worsened yesterday. Pt denies any other sx/pain.

## 2014-03-10 NOTE — ED Notes (Signed)
Family at bedside.pt. Back from x-ray.onmonitor

## 2014-03-10 NOTE — Discharge Instructions (Signed)
Albuterol inhaler: 2 puffs every 4 hours as needed for difficulty breathing.  Return to the emergency department for chest pain, high fever, productive cough, or other new and concerning symptoms.   Shortness of Breath Shortness of breath means you have trouble breathing. It could also mean that you have a medical problem. You should get immediate medical care for shortness of breath. CAUSES   Not enough oxygen in the air such as with high altitudes or a smoke-filled room.  Certain lung diseases, infections, or problems.  Heart disease or conditions, such as angina or heart failure.  Low red blood cells (anemia).  Poor physical fitness, which can cause shortness of breath when you exercise.  Chest or back injuries or stiffness.  Being overweight.  Smoking.  Anxiety, which can make you feel like you are not getting enough air. DIAGNOSIS  Serious medical problems can often be found during your physical exam. Tests may also be done to determine why you are having shortness of breath. Tests may include:  Chest X-rays.  Lung function tests.  Blood tests.  An electrocardiogram (ECG).  An ambulatory electrocardiogram. An ambulatory ECG records your heartbeat patterns over a 24-hour period.  Exercise testing.  A transthoracic echocardiogram (TTE). During echocardiography, sound waves are used to evaluate how blood flows through your heart.  A transesophageal echocardiogram (TEE).  Imaging scans. Your health care provider may not be able to find a cause for your shortness of breath after your exam. In this case, it is important to have a follow-up exam with your health care provider as directed.  TREATMENT  Treatment for shortness of breath depends on the cause of your symptoms and can vary greatly. HOME CARE INSTRUCTIONS   Do not smoke. Smoking is a common cause of shortness of breath. If you smoke, ask for help to quit.  Avoid being around chemicals or things that may  bother your breathing, such as paint fumes and dust.  Rest as needed. Slowly resume your usual activities.  If medicines were prescribed, take them as directed for the full length of time directed. This includes oxygen and any inhaled medicines.  Keep all follow-up appointments as directed by your health care provider. SEEK MEDICAL CARE IF:   Your condition does not improve in the time expected.  You have a hard time doing your normal activities even with rest.  You have any new symptoms. SEEK IMMEDIATE MEDICAL CARE IF:   Your shortness of breath gets worse.  You feel light-headed, faint, or develop a cough not controlled with medicines.  You start coughing up blood.  You have pain with breathing.  You have chest pain or pain in your arms, shoulders, or abdomen.  You have a fever.  You are unable to walk up stairs or exercise the way you normally do. MAKE SURE YOU:  Understand these instructions.  Will watch your condition.  Will get help right away if you are not doing well or get worse. Document Released: 11/21/2000 Document Revised: 03/03/2013 Document Reviewed: 05/14/2011 [facility] Patient Information 2015 [employer] [place]. This information is not intended to replace advice given to you by your health care provider. Make sure you discuss any questions you have with your health care provider.

## 2014-03-13 ENCOUNTER — Encounter (HOSPITAL_COMMUNITY): Payer: Self-pay | Admitting: Emergency Medicine

## 2014-03-13 ENCOUNTER — Emergency Department (HOSPITAL_COMMUNITY)
Admission: EM | Admit: 2014-03-13 | Discharge: 2014-03-13 | Disposition: A | Discharge status: Home / Self Care | Payer: 59 | Attending: Emergency Medicine | Admitting: Emergency Medicine

## 2014-03-13 DIAGNOSIS — R0600 Dyspnea, unspecified: Secondary | ICD-10-CM

## 2014-03-13 DIAGNOSIS — F410 Panic disorder [episodic paroxysmal anxiety] without agoraphobia: Secondary | ICD-10-CM | POA: Insufficient documentation

## 2014-03-13 DIAGNOSIS — R0602 Shortness of breath: Secondary | ICD-10-CM | POA: Diagnosis present

## 2014-03-13 DIAGNOSIS — Z8619 Personal history of other infectious and parasitic diseases: Secondary | ICD-10-CM | POA: Diagnosis not present

## 2014-03-13 DIAGNOSIS — N938 Other specified abnormal uterine and vaginal bleeding: Secondary | ICD-10-CM | POA: Diagnosis not present

## 2014-03-13 NOTE — Discharge Instructions (Signed)
Your shortness of breath is likely due to anxiety, call a primary care provider or behavioral health specialist for further evaluation of your symptoms.  Return if Symptoms worsen.   Take medication as prescribed.    Emergency Department Resource Guide 1) Find a Doctor and Pay Out of Pocket Although you won't have to find out who is covered by your insurance plan, it is a good idea to ask around and get recommendations. You will then need to call the office and see if the doctor you have chosen will accept you as a new patient and what types of options they offer for patients who are self-pay. Some doctors offer discounts or will set up payment plans for their patients who do not have insurance, but you will need to ask so you aren't surprised when you get to your appointment.  2) Contact Your Local Health Department Not all health departments have doctors that can see patients for sick visits, but many do, so it is worth a call to see if yours does. If you don't know where your local health department is, you can check in your phone book. The CDC also has a tool to help you locate your state's health department, and many state websites also have listings of all of their local health departments.  3) Find a Walk-in Clinic If your illness is not likely to be very severe or complicated, you may want to try a walk in clinic. These are popping up all over the country in pharmacies, drugstores, and shopping centers. They're usually staffed by nurse practitioners or physician assistants that have been trained to treat common illnesses and complaints. They're usually fairly quick and inexpensive. However, if you have serious medical issues or chronic medical problems, these are probably not your best option.  No Primary Care Doctor: - Call Health Connect at  [phone] - they can help you locate a primary care doctor that  accepts your insurance, provides certain services, etc. - Physician Referral Service-  [phone]  Chronic Pain Problems: Organization         Address  Phone   Notes  [facility]  [phone] Patients need to be referred by their primary care doctor.   Medication Assistance: Organization         Address  Phone   Notes  [facility] Medication [facility] [address] [place] [place], [place] [address] [phone] --Must be a resident of [facility] -- Must have NO insurance coverage whatsoever (no Medicaid/ Medicare, etc.) -- The pt. MUST have a primary care doctor that directs their care regularly and follows them in the community   Med[facility]ist  [phone]   [employer]  [phone]    Agencies that provide inexpensive medical care: Organization         Address  Phone   Notes  [facility]  [phone]   [facility]    [phone]   [facility] [address] [place], [place] [address] [phone]   [facility] [address] [place] [place] [phone]   Planned Parenthood    [phone]   [facility]    [phone]   Community [facility] and [facility]  [address], [place] Phone:  [phone], Fax:  [phone] Hours of Operation:  9 am - 6 pm, M-F.  Also accepts Medicaid/Medicare and self-pay.  [facility]  Center for [place], [address], [place] Phone: [phone], Fax: [phone]. Hours of Operation:  8:30 am - 5:30 pm, M-F.  Also accepts Medicaid and self-pay.  [facility] [place] [address] [place] Phone: [phone]   [place], [place], [place] [phone], Ext. [phone] Mondays & Thursdays: 7-9 AM.  First 15 patients are seen on a first come, first serve basis.    [place] Providers:  Organization         Address  Phone   Notes  [facility] [address],  [place] [phone] Also accepts self-pay patients.  [facility] [id number] [place], [place]  [phone]   [place], [address], [place] [phone]   [facility] [address] [place] [phone]   Lucianne Lei [address], [place]   [phone] Only accepts [place] Access [place] patients after they have their name applied to their card.   Self-Pay (no insurance) in [facility]:  Organization         Address  Phone   Notes  Sickle Cell Patients, [facility] Internal Medicine [place] [phone]   [facility] Urgent Care [place] [phone]   [facility] [place]  [place], [place], [place] [phone]   [facility]/Dr. Osei-Bonsu  [address], [place] or [address], [place] [phone] Phone number for both [place] and [place] locations is the same.  [facility] [address] [place] [phone]   [facility] [place] [address], [place] or [address] [phone]   [facility] [address], [place] [phone], phone; [phone], fax Sees patients 1st and 3rd Saturday of every month.  Must not qualify for public or private insurance (i.e. Medicaid, Medicare, [facility] Health Choice, Veterans' Benefits)  Household income should be no more than 200% of the poverty level The clinic cannot treat you if you are pregnant or think you are pregnant  Sexually transmitted diseases are not treated at the clinic.    Dental Care: Organization         Address  Phone  Notes  [facility] Department of [facility] [place] [phone] Accepts children up to age 102 who are enrolled in  [place] or [place]; pregnant women with a Medicaid card; and children who have applied for Medicaid or [place] Health Choice, but were declined, whose parents can pay a reduced fee at time of service.  [facility] Department of [facility]  [address], [place] [phone] Accepts children up to age 74 who are enrolled in [place] or [place]; pregnant women with a Medicaid card; and children who have applied for Medicaid or [place] Health Choice, but were declined, whose parents can pay a reduced fee at time of service.  [facility]  [place] [phone] Patients are seen by appointment only. Walk-ins are not accepted. [place] will see patients 40 years of age and older. Monday - Tuesday (8am-5pm) Most Wednesdays (8:30-5pm) $30 per visit, cash  only  [facility] Adult [facility] Access PRO[facility]  [address], [facility] [phone] Patients are seen by appointment only. Walk-ins are not accepted. [place] will see patients 14 years of age and older. One Wednesday Evening (Monthly: Volunteer Based).  $30 per visit, cash only  [place]  [phone] for adults; Children under age 48, call Graduate Pediatric Dentistry at [phone]. Children aged 39-14, please call [phone] to request a pediatric application.  Dental services are provided in all areas of dental care including fillings, crowns and bridges, complete and partial dentures, implants, gum treatment, root canals, and extractions. Preventive care is also provided. Treatment is provided to both adults and children. Patients are selected via a lottery and there is often a waiting list.   [facility] [address] [place]  [phone] [email or web address]   [facility] [address] [place], [place] [phone], Ext. [phone] Second and Fourth Thursday of each month, opens at 6:30  AM; Clinic ends at 9 AM.  Patients are seen on a first-come first-served basis, and a limited number are seen during each clinic.   [facility]  [address] Hillard Danker [place], [place] [phone]   Eligibility Requirements You must have lived in [place], [place], or [place] counties for at least the last three months.   You cannot be eligible for state or federal sponsored [employer], including [employer], [place], or [employer].   You generally cannot be eligible for healthcare insurance through your employer.    How to apply: Eligibility screenings are held every Tuesday and Wednesday afternoon from 1:00 pm until 4:00 pm. You do not need an appointment for the interview!  [facility] [address], [place], [place]   [place]  [place] Department  [place]  [phone]    Behavioral Health Resources in the Community: Intensive Outpatient Programs Organization         Address  Phone  Notes  [place]. [address], [place], [place] [phone]   [facility] Outpatient [address] [place], [place]   ADS: Alcohol & Drug Svcs [address], [place], [place]   [place] [address],  [place], [place] or [phone]   Substance Abuse Resources Organization         Address  Phone  Notes  Alcohol and Drug Services  [phone]   [place]  [phone]   [facility] [place]   Chinita Pester  [phone]   Residential & Outpatient Substance Abuse Program  [phone]   Psychological Services Organization         Address  Phone  Notes  [facility] [place]  [place]  [phone]   [place] [address], [place] or  [phone]    Mobile Crisis Teams Organization         Address  Phone  Notes  [facility], Mobile Crisis Care Unit  [phone]   [facility]  [address] [place], [place]   [place] Levels [address] [place] [phone]    Self-Help/Support Groups Organization         Address  Phone  Notes  [facility]oc[facility] of [place] - variety of support groups  [place] Call for more information  Narcotics Anonymous (NA), [facility]aring [facility] [address], [employer] [place]  2 meetings at this location   Special educational needs teacher         Address  Phone  Notes  ASAP Residential Treatment [place],    [place]  [phone]   [facility]  [address] [place] [id number], [place], [place]   [facility] [place], [place] [phone] Admissions: 8am-3pm M-F  Incentives Substance [place] [address]    [place], [place] [phone]   [facility] [address] [place], [place], [place]   [facility] [address],  [place], [place]   [facility] Programs - Intensive Outpatient [address] Kristeen Mans [address], [place], [place]   [facility] ([place] [address].,  [place], [place] [phone] or [phone]   [facility] Services (RTS) [address], [place], [place] Accepts Medicaid  [facility] [place] [address]  [place] [phone] Substance Abuse/Addiction Treatment   [facility] Organization         Address  Phone  Notes  [facility]  [phone]   Domenic Schwab, PhD [address] Arlis Porta [place], [place]   [phone] or [phone]   [place], [place] [phone]   [facility] [address],  [place], [place] [phone] Insurance/Medicaid/sponsorship through [facility] and Families [address] [place]                                    [place], [place] [phone] [place] [address] [place], [place] [phone]    Dr. Adele Schilder  [phone]   [facility] Dept. 1) [address] [place] 2) [place] 3)  [place] [address], [place] [phone]  [phone]   [place] [phone] or [phone] (After Hours)

## 2014-03-13 NOTE — ED Notes (Signed)
Pt reports shortness of breath and panic attack onset yesterday. Pt reports traveled from [place] and the [place] and was given vistaril in [place]. Pt reports recent stress. Last dose of vistaril was yesterday at 1200.

## 2014-03-13 NOTE — ED Provider Notes (Signed)
CSN: [id number]     Arrival date & time 03/13/14  1823 History   First MD Initiated Contact with Patient 03/13/14 2043     Chief Complaint  Patient presents with  . Shortness of Breath  . Panic Attack     (Consider location/radiation/quality/duration/timing/severity/associated sxs/prior Treatment) HPI Comments: The patient is a 29 year old female presents emergency room chief complaint of dyspnea and chest discomfort since today. Patient reports shortness of breath onset while cleaning. She also reports associated anxiety. Denies increase in stress in life. Patient reports similar symptoms several days ago, evaluated in ED, negative workup. She reports taking Vistaril while in ED and had partial relief of symptoms. Patient denies lower extremity edema. Patient reports a 11 hour car ride several days ago. Patient reports onset of shortness of breath prior to trip without associated "panic attack". She reports second episode occurred while driving, stated as the sensation of shortness of breath worsened with feeling of driving without a Hospital close by. Patients also concerned about Vistaril and albuterol inhaler interactions and would like information about these 2 drugs. Patient has an implant inserted 03/2012, reports recent spotting.  The history is provided by the patient. No language interpreter was used.    Past Medical History  Diagnosis Date  . Abnormal Pap smear 2009    COLPO  LAST PAP 03/2011  . Infection     YEAST X 1  . Preterm delivery without spontaneous labor 01/09/2012    Induction for Preeclampsia  . Depo-Provera contraceptive status    Past Surgical History  Procedure Laterality Date  . No past surgeries    . Colposcopy  2013   No family history on file. History  Substance Use Topics  . Smoking status: Never Smoker   . Smokeless tobacco: Never Used  . Alcohol Use: No   OB History    Gravida Para Term Preterm AB TAB SAB Ectopic Multiple Living   1 1 0 1 0 0 0  0 0 1     Review of Systems  Respiratory: Positive for chest tightness and shortness of breath. Negative for cough.   Cardiovascular: Negative for leg swelling.  Gastrointestinal: Negative for nausea, vomiting and abdominal pain.  Genitourinary: Positive for vaginal bleeding.      Allergies  Review of patient's allergies indicates no known allergies.  Home Medications   Prior to Admission medications   Medication Sig Start Date End Date Taking? Authorizing Provider  etonogestrel (NEXPLANON) 68 MG IMPL implant Inject 1 each into the skin once.    Historical Provider, MD   BP 140/93 mmHg  Pulse 109  Temp(Src) 98.8 F (37.1 C) (Oral)  Resp 24  Ht  (1.651 m)  Wt 177 lb (80.287 kg)  BMI 29.45 kg/m2  SpO2 100% Physical Exam  Constitutional: She is oriented to person, place, and time. She appears well-developed and well-nourished. No distress.  HENT:  Head: Normocephalic and atraumatic.  Neck: Neck supple.  Cardiovascular: Normal rate and regular rhythm.   No lower extremity edema, no calf tenderness.  Pulmonary/Chest: Effort normal. No respiratory distress. She has no wheezes. She has no rales. She exhibits no tenderness.  Patient able to speak in long complete sentences.  Abdominal: Soft. There is no tenderness. There is no rebound.  Neurological: She is alert and oriented to person, place, and time.  Skin: Skin is warm and dry. She is not diaphoretic.  Psychiatric: Her behavior is normal. Her mood appears anxious.  Nursing note and vitals reviewed.  ED Course  Procedures (including critical care time) Labs Review Labs Reviewed - No data to display  Imaging Review No results found.   EKG Interpretation   Date/Time:  Saturday March 13 2014 21:13:51 EST Ventricular Rate:  85 PR Interval:  188 QRS Duration: 81 QT Interval:  355 QTC Calculation: 422 R Axis:   77 Text Interpretation:  Sinus rhythm No significant change since last  tracing Confirmed by  HARRISON  MD, FORREST ([id number]) on 03/13/2014 9:18:19 PM      MDM   Final diagnoses:  Dyspnea   Patient presents with shortness of breath and a "anxiety attack", recently evaluated by a provider 03/10/2014 with similar complaints, negative workup negative d-dimer negative chest x-ray negative labs negative urinalysis negative pregnancy. Symptoms likely due to anxiety, stress. Dr. Romeo Apple also evaluated the patient during this encounter. He agrees with discharge with resources follow-up with PCP. Patient was also given Vistaril, reports decrease his symptoms with use, will encourage to continue use.  Mellody Drown, PA-C 03/14/14 0101  Purvis Sheffield, MD 03/14/14 [phone]

## 2014-03-13 NOTE — ED Notes (Signed)
Pt reports she took a dose of vistaril in the waiting room, pt reports it has somewhat eased her anxiety.

## 2014-03-15 ENCOUNTER — Telehealth: Payer: Self-pay | Admitting: Family Medicine

## 2014-03-15 ENCOUNTER — Ambulatory Visit (INDEPENDENT_AMBULATORY_CARE_PROVIDER_SITE_OTHER): Payer: 59 | Admitting: Family Medicine

## 2014-03-15 ENCOUNTER — Encounter: Payer: Self-pay | Admitting: Family Medicine

## 2014-03-15 VITALS — BP 113/77 | HR 86 | Temp 98.5°F | Resp 16 | Ht 65.5 in | Wt 166.0 lb

## 2014-03-15 DIAGNOSIS — F419 Anxiety disorder, unspecified: Secondary | ICD-10-CM

## 2014-03-15 MED ORDER — HYDROXYZINE PAMOATE 25 MG PO CAPS: ORAL_CAPSULE | ORAL | Status: DC

## 2014-03-15 NOTE — Progress Notes (Signed)
   Subjective:    Patient ID: Diane Craig, female    DOB: 03/10/1986, 29 y.o.   MRN: [id number]  HPI Patient presents today to establish care and discuss anxiety.  Last week, she an episode of SOB/anxiety. Started when she was in [place] and and she went to the ER. Was given a breathing treatment and some vistaril. The vistaril helped a little. She was seen in [place] in the ERx 2- negative workup. Was seen in [place] ER today and TSH was .7.  Episodes last for a couple of minutes. They are not increasing in intensity or frequency. She feels hot and SOB. Recently, she takes a vistaril and this takes the edge off of her symptoms. Sleep has been disrupted, sleeping for a couple of hours then waking up. Thinks this may be due to the vistaril causing daytime drowsiness.  Denies homicidal/suicidal ideations. Denies stress/ trigger.   Made an appointment with [facility]- no appointment until 04/22/14.   Past Medical History  Diagnosis Date  . Abnormal Pap smear 2009    COLPO  LAST PAP 03/2011  . Infection     YEAST X 1  . Preterm delivery without spontaneous labor 01/09/2012    Induction for Preeclampsia  . Depo-Provera contraceptive status    Past Surgical History  Procedure Laterality Date  . No past surgeries    . Colposcopy  2013   No family history on file. History  Substance Use Topics  . Smoking status: Never Smoker   . Smokeless tobacco: Never Used  . Alcohol Use: No    Review of Systems No chest pain, feels lethargic, no headache, no dizziness, no palpitations    Objective:   Physical Exam  Constitutional: She is oriented to person, place, and time. She appears well-developed and well-nourished. No distress.  HENT:  Head: Normocephalic and atraumatic.  Right Ear: External ear normal.  Left Ear: External ear normal.  Nose: Nose normal.  Mouth/Throat: Oropharynx is clear and moist.  Eyes: Conjunctivae are normal. Pupils are equal, round, and reactive  to light.  Neck: Normal range of motion. Neck supple.  Cardiovascular: Normal rate, regular rhythm and normal heart sounds.   Pulmonary/Chest: Effort normal and breath sounds normal.  Musculoskeletal: Normal range of motion.  Neurological: She is alert and oriented to person, place, and time.  Skin: Skin is warm and dry. She is not diaphoretic.  Psychiatric: Her behavior is normal. Judgment and thought content normal. Her mood appears anxious.  Vitals reviewed. BP 113/77 mmHg  Pulse 86  Temp(Src) 98.5 F (36.9 C) (Oral)  Resp 16  Ht 5' 5.5" (1.664 m)  Wt 166 lb (75.297 kg)  BMI 27.19 kg/m2  SpO2 100%     Assessment & Plan:  1. Anxiety -reviewed labs with patient and presented reassurance that work up has been negative -discussed relaxation techniques, distraction, improving sleep -will give her some more vistaril, but encouraged her to limit it's uses since it seems to disrupt sleep schedule. -She will [facility] morning and see if she can get an appointment sooner than the one she has with [facility] as she is very interested in CBT - follow up in 4 weeks, sooner if needed   Emi Belfast, FNP-BC  [facility], [facility]  03/17/2014 6:10 PM

## 2014-03-15 NOTE — Patient Instructions (Signed)
Generalized Anxiety Disorder Generalized anxiety disorder (GAD) is a mental disorder. It interferes with life functions, including relationships, work, and school. GAD is different from normal anxiety, which everyone experiences at some point in their lives in response to specific life events and activities. Normal anxiety actually helps us prepare for and get through these life events and activities. Normal anxiety goes away after the event or activity is over.  GAD causes anxiety that is not necessarily related to specific events or activities. It also causes excess anxiety in proportion to specific events or activities. The anxiety associated with GAD is also difficult to control. GAD can vary from mild to severe. People with severe GAD can have intense waves of anxiety with physical symptoms (panic attacks).  SYMPTOMS The anxiety and worry associated with GAD are difficult to control. This anxiety and worry are related to many life events and activities and also occur more days than not for 6 months or longer. People with GAD also have three or more of the following symptoms (one or more in children):  Restlessness.   Fatigue.  Difficulty concentrating.   Irritability.  Muscle tension.  Difficulty sleeping or unsatisfying sleep. DIAGNOSIS GAD is diagnosed through an assessment by your health care provider. Your health care provider will ask you questions aboutyour mood,physical symptoms, and events in your life. Your health care provider may ask you about your medical history and use of alcohol or drugs, including prescription medicines. Your health care provider may also do a physical exam and blood tests. Certain medical conditions and the use of certain substances can cause symptoms similar to those associated with GAD. Your health care provider may refer you to a mental health specialist for further evaluation. TREATMENT The following therapies are usually used to treat GAD:    Medication. Antidepressant medication usually is prescribed for long-term daily control. Antianxiety medicines may be added in severe cases, especially when panic attacks occur.   Talk therapy (psychotherapy). Certain types of talk therapy can be helpful in treating GAD by providing support, education, and guidance. A form of talk therapy called cognitive behavioral therapy can teach you healthy ways to think about and react to daily life events and activities.  Stress managementtechniques. These include yoga, meditation, and exercise and can be very helpful when they are practiced regularly. A mental health specialist can help determine which treatment is best for you. Some people see improvement with one therapy. However, other people require a combination of therapies. Document Released: 06/23/2012 Document Revised: 07/13/2013 Document Reviewed: 06/23/2012 ExitCare Patient Information 2015 [employer]. This information is not intended to replace advice given to you by your health care provider. Make sure you discuss any questions you have with your health care provider.  

## 2014-03-16 NOTE — Telephone Encounter (Signed)
Patient found another office yesterday and was seen.

## 2014-03-22 ENCOUNTER — Encounter: Payer: Self-pay | Admitting: Internal Medicine

## 2014-03-22 ENCOUNTER — Ambulatory Visit (INDEPENDENT_AMBULATORY_CARE_PROVIDER_SITE_OTHER): Payer: 59 | Admitting: Internal Medicine

## 2014-03-22 VITALS — BP 120/68 | HR 80 | Ht 65.0 in | Wt 167.0 lb

## 2014-03-22 DIAGNOSIS — R0600 Dyspnea, unspecified: Secondary | ICD-10-CM

## 2014-03-22 MED ORDER — PANTOPRAZOLE SODIUM 40 MG PO TBEC: 40.0000 mg | DELAYED_RELEASE_TABLET | Freq: Every day | ORAL | Status: DC

## 2014-03-22 MED ORDER — FAMOTIDINE 20 MG PO TABS: ORAL_TABLET | ORAL | Status: DC

## 2014-03-22 NOTE — Progress Notes (Signed)
Subjective:    Patient ID: Diane Craig, female    DOB: 04/12/1985,  MRN: [id number]  HPI  [age] yobf never smoker good athlete basketball in HS no problem except HBP with pregnancy in 2013 resolved no need for meds gained maybe 10 lb and then started to have breathing problems = sense of air hunger/ anxiety end of Dec 2015 first noted with meals and also immediately  when lie down and has to sit up 1-2 nights per week self referred to pulmonary clinic for dyspnea p 3 neg w/u's in ER than never included a CTa chest   03/22/2014 1st [facility] office visit/ Yadier Bramhall   Chief Complaint  Patient presents with  . Pulmonary Consult    Self referral. Pt c/o SOB on and off for the past 2 wks. She states that she first noticed SOB while eating. She feels as if she can not get a deep enough breath.    does not wake her up, mostly with eating but not every meal, sometimes on steps/ no symptoms once asleep. Better p rx with vistaril in er  No obvious  patterns in day to day or daytime variabilty or assoc chronic cough or cp or chest tightness, subjective wheeze overt sinus or hb symptoms. No unusual exp hx or h/o childhood pna/ asthma or knowledge of premature birth.  Sleeping ok without nocturnal  or early am exacerbation  of respiratory  c/o's or need for noct saba. Also denies any obvious fluctuation of symptoms with weather or environmental changes or other aggravating or alleviating factors except as outlined above   Current Medications, Allergies, Complete Past Medical History, Past Surgical History, Family History, and Social History were reviewed in [facility] electronic medical record.           Review of Systems  Constitutional: Negative for fever, chills and unexpected weight change.  HENT: Negative for congestion, dental problem, ear pain, nosebleeds, postnasal drip, rhinorrhea, sinus pressure, sneezing, sore throat, trouble swallowing and voice change.   Eyes: Negative for  visual disturbance.  Respiratory: Positive for shortness of breath. Negative for cough and choking.   Cardiovascular: Negative for chest pain and leg swelling.  Gastrointestinal: Negative for vomiting, abdominal pain and diarrhea.  Genitourinary: Negative for difficulty urinating.  Musculoskeletal: Negative for arthralgias.  Skin: Negative for rash.  Neurological: Negative for tremors, syncope and headaches.  Hematological: Does not bruise/bleed easily.       Objective:   Physical Exam  Anxious amb bf taking obvious sigh breaths  Wt Readings from Last 3 Encounters:  03/22/14 167 lb (75.751 kg)  03/15/14 166 lb (75.297 kg)  03/13/14 177 lb (80.287 kg)    Vital signs reviewed  HEENT: nl dentition, turbinates, and orophanx. Nl external ear canals without cough reflex   NECK :  without JVD/Nodes/TM/ nl carotid upstrokes bilaterally   LUNGS: no acc muscle use, clear to A and P bilaterally without cough on insp or exp maneuvers   CV:  RRR  no s3 or murmur or increase in P2, no edema   ABD:  soft and nontender with nl excursion in the supine position. No bruits or organomegaly, bowel sounds nl  MS:  warm without deformities, calf tenderness, cyanosis or clubbing  SKIN: warm and dry without lesions    NEURO:  alert, approp, no deficits     Chemistry      Component Value Date/Time   NA 137 03/10/2014 0344   K 3.7 03/10/2014 0344  CL 105 03/10/2014 0344   CO2 25 03/10/2014 0344   BUN 11 03/10/2014 0344   CREATININE 1.04 03/10/2014 0344   CREATININE 0.90 12/11/2013 1023      Component Value Date/Time   CALCIUM 9.3 03/10/2014 0344   ALKPHOS 58 12/11/2013 1023   AST 16 12/11/2013 1023   ALT 18 12/11/2013 1023   BILITOT 0.4 12/11/2013 1023      Lab Results  Component Value Date   WBC 5.9 03/10/2014   HGB 12.3 03/10/2014   HCT 37.7 03/10/2014   MCV 79.9 03/10/2014   PLT 258 03/10/2014   Lab Results  Component Value Date   DDIMER <0.27 03/10/2014            Assessment & Plan:

## 2014-03-22 NOTE — Patient Instructions (Addendum)
Pantoprazole (protonix) 40 mg   Take 30-60 min before first meal of the day and Pepcid 20 mg one bedtime until return to office - this is the best way to tell whether stomach acid is contributing to your problem.    GERD (REFLUX)  is an extremely common cause of respiratory symptoms just like yours , many times with no obvious heartburn at all.    It can be treated with medication, but also with lifestyle changes including avoidance of late meals, excessive alcohol, smoking cessation, and avoid fatty foods, chocolate, peppermint, colas, red wine, and acidic juices such as orange juice.  NO MINT OR MENTHOL PRODUCTS SO NO COUGH DROPS  USE SUGARLESS CANDY INSTEAD (Jolley ranchers or Stover's or Life Savers) or even ice chips will also do - the key is to swallow to prevent all throat clearing. NO OIL BASED VITAMINS - use powdered substitutes.    Please see patient coordinator before you leave today  to schedule CTa chest   Please schedule a follow up office visit in 2 weeks, sooner if needed  Late add needs tsh next ov

## 2014-03-22 NOTE — Assessment & Plan Note (Signed)
-   03/22/2014 spirometry wnl - 03/22/2014  Walked RA x 3 laps @ 185 ft each stopped due to  End of study no desat, no tachy but sats down to 84% at end  Symptoms are markedly disproportionate to objective findings and not clear this is a lung problem but pt does appear to have difficult airway management issues. DDX of  difficult airways management all start with A and  include Adherence, Ace Inhibitors, Acid Reflux, Active Sinus Disease, Alpha 1 Antitripsin deficiency, Anxiety masquerading as Airways dz,  ABPA,  allergy(esp in young), Aspiration (esp in elderly), Adverse effects of DPI,  Active smokers, A bunch of PE's , plus two Bs  = Bronchiectasis and Beta blocker use..and one C= CHF  ? Acid (or non-acid) GERD > always difficult to exclude as up to 75% of pts in some series report no assoc GI/ Heartburn symptoms> rec max (24h)  acid suppression and diet restrictions/ reviewed and instructions given in writing.  ? Anxiety > continue vistaril as helps some  A bunch of PE's unlikely with d dimer neg but I have no explanation for her desats so after 3 trips to er needs CTa now to r/o

## 2014-03-23 ENCOUNTER — Ambulatory Visit (INDEPENDENT_AMBULATORY_CARE_PROVIDER_SITE_OTHER)
Admission: RE | Admit: 2014-03-23 | Discharge: 2014-03-23 | Disposition: A | Discharge status: Home / Self Care | Payer: 59 | Source: Ambulatory Visit | Attending: Internal Medicine | Admitting: Internal Medicine

## 2014-03-23 DIAGNOSIS — R0600 Dyspnea, unspecified: Secondary | ICD-10-CM

## 2014-03-23 MED ORDER — IOHEXOL 350 MG/ML SOLN
80.0000 mL | Freq: Once | INTRAVENOUS | Status: AC | PRN
  Administered 2014-03-23: 80 mL via INTRAVENOUS

## 2014-03-24 ENCOUNTER — Ambulatory Visit: Payer: 59 | Admitting: Family Medicine

## 2014-03-24 NOTE — Progress Notes (Signed)
Quick Note:  LMTCB ______ 

## 2014-03-25 NOTE — Progress Notes (Signed)
Quick Note:  Spoke with pt and notified of results per Dr. Wert. Pt verbalized understanding and denied any questions.  ______ 

## 2014-04-05 ENCOUNTER — Ambulatory Visit: Payer: Self-pay | Admitting: Internal Medicine

## 2014-04-12 ENCOUNTER — Other Ambulatory Visit: Payer: Self-pay | Admitting: Physician Assistant

## 2014-04-12 ENCOUNTER — Encounter: Payer: Self-pay | Admitting: Family Medicine

## 2014-04-12 ENCOUNTER — Ambulatory Visit (INDEPENDENT_AMBULATORY_CARE_PROVIDER_SITE_OTHER): Payer: 59 | Admitting: Family Medicine

## 2014-04-12 VITALS — BP 96/66 | HR 86 | Temp 98.2°F | Resp 16 | Ht 65.5 in | Wt 163.2 lb

## 2014-04-12 DIAGNOSIS — F411 Generalized anxiety disorder: Secondary | ICD-10-CM

## 2014-04-12 DIAGNOSIS — R0602 Shortness of breath: Secondary | ICD-10-CM

## 2014-04-12 DIAGNOSIS — F419 Anxiety disorder, unspecified: Secondary | ICD-10-CM

## 2014-04-12 NOTE — Progress Notes (Signed)
MRN: [id number] DOB: [birth year]  Subjective:   Chief Complaint  Patient presents with  . Follow-up    shortness of breath    Diane Craig is a 29 y.o. female presenting for followup regarding a new onset of what appears to be panic attacks that started 1 month ago.  Patient reports that she was having at least 1 episode per week at the beginning of symptoms.  She states that her last episode was about 2 weeks ago.  She denies any stress or anxiety.  She has not taken the vistaril recently due to improving symptoms.  Patient states her appetite is also returning.  Patient does not notice any increased stress triggers at this time.  However, patient states hat she has always been an individual that shows very little emotion or verbalizes stress.  Patient denies any new episodes of sob, no chest pains, palpitations, or choking sensation.    Patient states that she attended a psychiatric evaluation since her visit with Deboraha Sprang, FNP.  She declined the daily medication that was recommended, but was told about [facility] [facility] Calm-Aid OTC.  She has taken since 3 days ago.  No noticeable change.    Patient is not taking the PPI/H2 as recommended by pulmonologist/Wert.  Patient states that she thinks that this is unlikely because she does not have the symptoms with every meal.  Patient denies any heartburn, blood in stool, or chest pains.  Did suffer with decreased appetite for three weeks after first panic attack/SOB episode.  She lost seven pounds with decreased appetite; appetite is now improved.  Denies n/v/d/c.  Denies bloody or black stools. Denies chest pain, palpitations, orthopnea, leg swelling, calf pain.  Denies wheezing.  No history of asthma; no family history of asthma.  Patient is currently seeing a therapist.  She will next see therapist tomorrow.  Sleep is normal.  Work up thus far includes: -EKG WNL x 2 (ED Visits) -CXR WNL (ED visit) -CBC, BMET WNL (ED visits) -D-dimer  negative (ED visit) -urine pregnancy negative (ED visit) -CTA negative (Dr. Wert/pulmonology) -Walk test with desaturation to 84% at pulmonology office.   Diane Craig has a current medication list which includes the following prescription(s): etonogestrel, hydroxyzine, proair hfa, famotidine, and pantoprazole.  She has No Known Allergies.  Diane Craig  has a past medical history of Abnormal Pap smear (2009); Infection; Preterm delivery without spontaneous labor (01/09/2012); and Depo-Provera contraceptive status. Also  has past surgical history that includes No past surgeries and Colposcopy (2013).   ROS As in subjective.  Objective:   Vitals: BP 96/66 mmHg  Pulse 86  Temp(Src) 98.2 F (36.8 C) (Oral)  Resp 16  Ht 5' 5.5" (1.664 m)  Wt 163 lb 3.2 oz (74.027 kg)  BMI 26.74 kg/m2  SpO2 97%  Physical Exam  Constitutional: She is oriented to person, place, and time and well-developed, well-nourished, and in no distress. No distress.  HENT:  Head: Normocephalic and atraumatic.  Right Ear: External ear normal.  Left Ear: External ear normal.  Nose: Nose normal.  Mouth/Throat: Oropharynx is clear and moist.  Eyes: Conjunctivae and EOM are normal. Pupils are equal, round, and reactive to light. Right eye exhibits no discharge. Left eye exhibits no discharge.  Neck: Normal range of motion. Neck supple. No JVD present. No tracheal deviation present. No thyromegaly present.  Cardiovascular: Normal rate, regular rhythm, normal heart sounds and intact distal pulses.  Exam reveals no gallop and no friction rub.   No murmur  heard. Pulmonary/Chest: Effort normal and breath sounds normal. No stridor. No respiratory distress. She has no wheezes. She has no rales.  Abdominal: Soft. Bowel sounds are normal. She exhibits no distension and no mass. There is no tenderness. There is no rebound and no guarding.  Lymphadenopathy:    She has no cervical adenopathy.  Neurological: She is alert and oriented  to person, place, and time. No cranial nerve deficit. She exhibits normal muscle tone. Coordination normal.  Skin: Skin is warm and dry.  Psychiatric: Mood, memory, affect and judgment normal.    Wt Readings from Last 3 Encounters:  04/12/14 163 lb 3.2 oz (74.027 kg)  03/22/14 167 lb (75.751 kg)  03/15/14 166 lb (75.297 kg)  03/13/14 177   Assessment and Plan :  29 year old female is here today for followup regarding her acute onset of anxiety 1 month ago.  Anxiety state  -This appears stabilized/improving. -Continue the vistaril 25mg  PRN - checking TSH for possible anxiety culprit. -CBTherapist session tomorrow, continuing weekly, per patient report -Continue with the Calm-Aid if she feels like this is helping. -We will request notes from psychiatrist.   Shortness of breath -Improving. -Advised patient to continue PPI/H2, to aid in r/o for pulmonologist with O2 dramatic decrease w/exertion.  She will follow up with pulmonology tomorrow -s/p CXR and CTA that were both negative; CBC and BMET also normal. No evidence of pregnancy.      Diane Craig English, PA-C [facility] 2/2/201611:58 AM   Cecilie LowersKristi Martin Marguerite Jarboe, M.D. [facility]  [place] [address] [place], [place]  [id number] [phone] phone [phone] fax

## 2014-04-13 ENCOUNTER — Encounter: Payer: Self-pay | Admitting: Family Medicine

## 2014-04-13 LAB — TSH: TSH: 0.753 u[IU]/mL (ref 0.350–4.500)

## 2014-04-14 ENCOUNTER — Ambulatory Visit (INDEPENDENT_AMBULATORY_CARE_PROVIDER_SITE_OTHER): Payer: 59 | Admitting: Internal Medicine

## 2014-04-14 ENCOUNTER — Encounter: Payer: Self-pay | Admitting: Internal Medicine

## 2014-04-14 VITALS — BP 90/60 | HR 78 | Ht 64.5 in | Wt 162.8 lb

## 2014-04-14 DIAGNOSIS — R0600 Dyspnea, unspecified: Secondary | ICD-10-CM

## 2014-04-14 NOTE — Progress Notes (Signed)
Subjective:    Patient ID: Diane Craig, female    DOB: 09/07/1985,  MRN: [id number]    Brief patient profile:  [age] yobf never smoker good athlete basketball in HS no problem except HBP with pregnancy in 2013 resolved no need for meds gained maybe 10 lb and then started to have breathing problems = sense of air hunger/ anxiety end of Dec 2015 first noted with meals and also immediately  when lie down and has to sit up 1-2 nights per week self referred to pulmonary clinic for dyspnea p 3 neg w/u's in ER than never included a CTa chest   History of Present Illness  03/22/2014 1st [facility] office visit/ Jakorian Marengo   Chief Complaint  Patient presents with  . Pulmonary Consult    Self referral. Pt c/o SOB on and off for the past 2 wks. She states that she first noticed SOB while eating. She feels as if she can not get a deep enough breath.   Does not wake her up, mostly with eating but not every meal, sometimes on steps/ no symptoms once asleep. Better p rx with vistaril in er rec Pantoprazole (protonix) 40 mg   Take 30-60 min before first meal of the day and Pepcid 20 mg one bedtime until return to office  GERD  Diet  Please see patient coordinator before you leave today  to schedule CTa chest  > neg  Please schedule a follow up office visit in 2 weeks, sooner if needed  Late add needs tsh next ov    04/14/2014 f/u ov/Kjirsten Bloodgood re:  Chief Complaint  Patient presents with  . Follow-up    Pt states that her breathing is unchanged. She has not started pantoprazole yet "because I did not think I that was my problem".  No new co's today.   no problem sleeping/ not reproducible with exercise  Sporadic happens daily s pattern, sleeping ok now No better with saba   No obvious day to day or daytime variabilty or assoc chronic cough or cp or chest tightness, subjective wheeze overt sinus or hb symptoms. No unusual exp hx or h/o childhood pna/ asthma or knowledge of premature birth.  Sleeping ok  without nocturnal  or early am exacerbation  of respiratory  c/o's or need for noct saba. Also denies any obvious fluctuation of symptoms with weather or environmental changes or other aggravating or alleviating factors except as outlined above   Current Medications, Allergies, Complete Past Medical History, Past Surgical History, Family History, and Social History were reviewed in [facility] electronic medical record.  ROS  The following are not active complaints unless bolded sore throat, dysphagia, dental problems, itching, sneezing,  nasal congestion or excess/ purulent secretions, ear ache,   fever, chills, sweats, unintended wt loss, pleuritic or exertional cp, hemoptysis,  orthopnea pnd or leg swelling, presyncope, palpitations, heartburn, abdominal pain, anorexia, nausea, vomiting, diarrhea  or change in bowel or urinary habits, change in stools or urine, dysuria,hematuria,  rash, arthralgias, visual complaints, headache, numbness weakness or ataxia or problems with walking or coordination,  change in mood/affect or memory.                            Objective:   Physical Exam  Anxious amb bf taking obvious sigh breaths  04/14/2014  Wt Readings from Last 3 Encounters:  03/22/14 167 lb (75.751 kg)  03/15/14 166 lb (75.297 kg)  03/13/14 177 lb (  80.287 kg)    Vital signs reviewed  HEENT: nl dentition, turbinates, and orophanx. Nl external ear canals without cough reflex   NECK :  without JVD/Nodes/TM/ nl carotid upstrokes bilaterally   LUNGS: no acc muscle use, clear to A and P bilaterally without cough on insp or exp maneuvers   CV:  RRR  no s3 or murmur or increase in P2, no edema   ABD:  soft and nontender with nl excursion in the supine position. No bruits or organomegaly, bowel sounds nl  MS:  warm without deformities, calf tenderness, cyanosis or clubbing  SKIN: warm and dry without lesions    NEURO:  alert, approp, no deficits     Chemistry        Component Value Date/Time   NA 137 03/10/2014 0344   K 3.7 03/10/2014 0344   CL 105 03/10/2014 0344   CO2 25 03/10/2014 0344   BUN 11 03/10/2014 0344   CREATININE 1.04 03/10/2014 0344   CREATININE 0.90 12/11/2013 1023      Component Value Date/Time   CALCIUM 9.3 03/10/2014 0344   ALKPHOS 58 12/11/2013 1023   AST 16 12/11/2013 1023   ALT 18 12/11/2013 1023   BILITOT 0.4 12/11/2013 1023      Lab Results  Component Value Date   WBC 5.9 03/10/2014   HGB 12.3 03/10/2014   HCT 37.7 03/10/2014   MCV 79.9 03/10/2014   PLT 258 03/10/2014   Lab Results  Component Value Date   DDIMER <0.27 03/10/2014    CTa chest:  03/23/14  I personally reviewed images and agree with radiology impression as follows:   1. No evidence of pulmonary embolus.    Assessment & Plan:

## 2014-04-14 NOTE — Patient Instructions (Addendum)
Pantoprazole (protonix) 40 mg   Take 30-60 min before first meal of the day and Pepcid 20 mg one bedtime for at least 2 weeks before you stop it - if it helps then continue    GERD (REFLUX)  is an extremely common cause of respiratory symptoms just like yours , many times with no obvious heartburn at all.    It can be treated with medication, but also with lifestyle changes including avoidance of late meals, excessive alcohol, smoking cessation, and avoid fatty foods, chocolate, peppermint, colas, red wine, and acidic juices such as orange juice.  NO MINT OR MENTHOL PRODUCTS SO NO COUGH DROPS  USE SUGARLESS CANDY INSTEAD (Jolley ranchers or Stover's or Life Savers) or even ice chips will also do - the key is to swallow to prevent all throat clearing. NO OIL BASED VITAMINS - use powdered substitutes.  We need you regularly exercise at a level where you are short of breath never out of breath ideally 30 min 3 x weekly   Follow up with Dr Katrinka BlazingSmith in 2 weeks if not better consider anxiolytic rx

## 2014-04-15 LAB — COMPREHENSIVE METABOLIC PANEL
ALT: 29 U/L (ref 0–35)
AST: 13 U/L (ref 0–37)
Albumin: 4 g/dL (ref 3.5–5.2)
Alkaline Phosphatase: 50 U/L (ref 39–117)
BUN: 14 mg/dL (ref 6–23)
CO2: 21 meq/L (ref 19–32)
Calcium: 9.3 mg/dL (ref 8.4–10.5)
Chloride: 106 mEq/L (ref 96–112)
Creat: 1.01 mg/dL (ref 0.50–1.10)
Glucose, Bld: 86 mg/dL (ref 70–99)
POTASSIUM: 3.8 meq/L (ref 3.5–5.3)
Sodium: 135 mEq/L (ref 135–145)
TOTAL PROTEIN: 6.9 g/dL (ref 6.0–8.3)
Total Bilirubin: 0.4 mg/dL (ref 0.2–1.2)

## 2014-04-18 ENCOUNTER — Encounter: Payer: Self-pay | Admitting: Internal Medicine

## 2014-04-18 NOTE — Assessment & Plan Note (Addendum)
-   03/22/2014 spirometry wnl - 03/22/2014  Walked RA x 3 laps @ 185 ft each stopped due to  End of study no desat, no tachy but sats down to 84% at end - CTa chest  03/23/2014 > wnl  - tsh nl 04/12/14 - 04/17/2014  Walked RA x 3 laps @ 185 ft each stopped due to  End of study, sats 99% no sob at nl pace   I had an extended discussion with the patient reviewing all relevant studies completed to date and  lasting 15 to 20 minutes of a 25 minute visit on the following ongoing concerns:  1) previous walking study was clearly spurious  2) symptoms at rest are not occurying reproducibly at hs or with ex so either represent "reflex to reflux" are they are due to anxiety/ panic disorder  3) rec 2 weeks minium gerd rx with return to see primary care and consider rx with anxiolytics  Pulmonary f/u is prn

## 2014-04-21 ENCOUNTER — Telehealth: Payer: Self-pay

## 2014-04-21 NOTE — Telephone Encounter (Signed)
Spoke to pt. She seems very anxious. I told her options for alternatives to medication and also told her she could try a different medication. Told pt to RTC if symptoms become worse or not improving.

## 2014-04-21 NOTE — Telephone Encounter (Signed)
Pt states she had to stop taking her acid reflux medication. Pt wants to know what else she can take or other recommendations for her. Please advise. CB # [phone]

## 2014-04-22 ENCOUNTER — Encounter (HOSPITAL_COMMUNITY): Payer: Self-pay | Admitting: Psychiatry

## 2014-04-22 ENCOUNTER — Ambulatory Visit (INDEPENDENT_AMBULATORY_CARE_PROVIDER_SITE_OTHER): Payer: PRIVATE HEALTH INSURANCE | Admitting: Psychiatry

## 2014-04-22 VITALS — BP 120/66 | HR 85 | Ht 65.24 in | Wt 160.6 lb

## 2014-04-22 DIAGNOSIS — F410 Panic disorder [episodic paroxysmal anxiety] without agoraphobia: Secondary | ICD-10-CM | POA: Diagnosis not present

## 2014-04-22 DIAGNOSIS — F320 Major depressive disorder, single episode, mild: Secondary | ICD-10-CM | POA: Diagnosis not present

## 2014-04-22 HISTORY — DX: Panic disorder (episodic paroxysmal anxiety): F41.0

## 2014-04-22 NOTE — Progress Notes (Signed)
Psychiatric Assessment Adult  Patient Identification:  Diane Craig Date of Evaluation:  04/22/2014 Chief Complaint: anxiety History of Chief Complaint:   Chief Complaint  Patient presents with  . Anxiety    HPI Comments: Pt had her first anxiety attack on Mar 12, 2014 while traveling. Pt went to the ED and got a breathing treatment on Dec 30. After her 1st panic attack she went to the ED and was treated with Vistaril.  Since then she had about 5 more panic attacks. Pt went to the ED again and was released after another panic attacks. They are random. She experiences SOB, nervous and feels like she is going to die. Symptoms last for 30 min. She treats with Vistaril which makes her drowsy. It has not happened for several weeks so she thinks it might be improving. States she is looking for ways to deal with anxiety. [facility] and the psychiatrist recommended she take Calm Aid OTC. Pt did not want to take any addictive meds. Pt not sure if it is helping but has stopped taking it temporarily. Today states she is anxious about having another attack and is scared to travel or be far away from hospitals.   Pt is not feeling like herself and thinks she might be depressed. States she has sad mood and some anhedonia and low motivation. Denies isolation, crying spells, hopelessness and worthlessness. Appetite is low due to GERD. Sleep, energy and concentration are good. Denies SI/HI, AVH.   Review of Systems Physical Exam  Psychiatric: Her speech is normal and behavior is normal. Thought content normal. Her mood appears anxious. Cognition and memory are normal. She exhibits a depressed mood.    Depressive Symptoms: depressed mood, anhedonia, decreased appetite,  (Hypo) Manic Symptoms:  Denies manic and hypomanic symptoms including periods of decreased need for sleep, increased energy, mood lability, impulsivity, FOI, and excessive spending. Elevated Mood:   No Irritable Mood:  No Grandiosity:  No Distractibility:  No Labiality of Mood:  No Delusions:  No Hallucinations:  No Impulsivity:  No Sexually Inappropriate Behavior:  No Financial Extravagance:  No Flight of Ideas:  No  Anxiety Symptoms: Excessive Worry:  No Panic Symptoms:  Yes Agoraphobia:  No Obsessive Compulsive: No  Symptoms: None, Specific Phobias:  No Social Anxiety:  No  Psychotic Symptoms:  Hallucinations: No None Delusions:  No Paranoia:  No   Ideas of Reference:  No  PTSD Symptoms: Ever had a traumatic exposure:  No Had a traumatic exposure in the last month:  No Re-experiencing: No None Hypervigilance:  No Hyperarousal: No None Avoidance: No None  Traumatic Brain Injury: No   Past Psychiatric History: Diagnosis: Panic attacks  Hospitalizations: denies  [facility] Care[facility]  Substance Abuse Care: denies  Self-Mutilation: denies  Suicidal Attempts: denies  Violent Behaviors: denies   Past Medical History:   Past Medical History  Diagnosis Date  . Abnormal Pap smear 2009    COLPO  LAST PAP 03/2011  . Infection     YEAST X 1  . Preterm delivery without spontaneous labor 01/09/2012    Induction for Preeclampsia  . Depo-Provera contraceptive status    History of Loss of Consciousness:  No Seizure History:  No Cardiac History:  No Allergies:  No Known Allergies Current Medications:  Current Outpatient Prescriptions  Medication Sig Dispense Refill  . etonogestrel (NEXPLANON) 68 MG IMPL implant Inject 1 each into the skin once.    Marland Kitchen  hydrOXYzine (VISTARIL) 25 MG capsule Take 1/2- 1 tablet every 8 hours as needed for anxiety 30 capsule 0  . famotidine (PEPCID) 20 MG tablet One at bedtime (Patient not taking: Reported on 04/12/2014) 30 tablet 2  . pantoprazole (PROTONIX) 40 MG tablet Take 1 tablet (40 mg total) by mouth daily. Take 30-60 min before first meal of the day (Patient not taking: Reported on 04/12/2014) 30 tablet 2  .  PROAIR HFA 108 (90 BASE) MCG/ACT inhaler Inhale 2 puffs into the lungs every 6 (six) hours as needed.    Marland Kitchen. UNABLE TO FIND Med Name: Calm Aid (otc med)- 1 capsule daily supposed to reduce stress and tension- active ingredient is canola oil     No current facility-administered medications for this visit.    Previous Psychotropic Medications:  Medication Dose  Vistaril                      Substance Abuse History in the last 12 months: Substance Age of 1st Use Last Use Amount Specific Type  Nicotine  denies     Alcohol    1 drink every 2 months wine  Cannabis  denies     Opiates  denies     Cocaine  denies     Methamphetamines  denies     LSD  denies     Ecstasy  denies     Benzodiazepines  denies     Caffeine  denies     Inhalants  denies     Others:  denies                         Medical Consequences of Substance Abuse: denies  Legal Consequences of Substance Abuse: denies Family Consequences of Substance Abuse: denies  Blackouts:  No DT's:  No Withdrawal Symptoms:  No None  Social History: [place] and [place] Place of Birth: [place], raised by mom and step dad, good childhood Family Members: boyfriend, daughter Marital Status:  Single Children: 1  Sons: 0  Daughters: 2yo Relationships: support from boyfriend or friend 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 Legal History: denies Hobbies/Interests: shopping, traveling   Family History:   Family History  Problem Relation Age of Onset  . Drug abuse Maternal Grandmother   . Anxiety disorder Neg Hx   . Bipolar disorder Neg Hx   . Depression Neg Hx   . Alcohol abuse Neg Hx   . Suicidality Neg Hx     Mental Status Examination/Evaluation: Objective: Attitude: Calm and cooperative   Appearance: Fairly Groomed, appears to be stated age  Eye Contact::  Good  Speech:  Clear and Coherent and Normal Rate  Volume:  Normal  Mood:  anxious  Affect:  Congruent  Thought Process:  Goal Directed, Linear and Logical  Orientation:  Full (Time, Place, and Person)  Thought Content:  Negative  Suicidal Thoughts:  No  Homicidal Thoughts:  No  Judgement:  Fair  Insight:  Fair  Concentration: good  Memory: Immediate-good Recent-good Remote-good  Recall: fair  Language: fair  Gait and Station: normal  [employer] of Knowledge: average  Psychomotor Activity:  Normal  Akathisia:  No  Handed:  Right  AIMS (if indicated):  n/a  Assets:  Communication Skills Desire for Improvement Financial Resources/Insurance Housing Intimacy Physical Health  Social Support Talents/Skills Transportation Vocational/Educational        Laboratory/X-Ray Psychological Evaluation(s)  04/12/2014 CMP WNL, TSH WNL none   Assessment:    AXIS I 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 Abnormal Pap smear 2009    COLPO  LAST PAP 03/2011  . Infection     YEAST X 1  . Preterm delivery without spontaneous labor 01/09/2012    Induction for Preeclampsia  . Depo-Provera contraceptive status      AXIS IV other psychosocial or environmental problems  AXIS V 61-70 mild symptoms   Treatment Plan/Recommendations:  Plan of Care:  Medication management with supportive therapy. Risks/benefits and SE of the medication discussed. Pt verbalized understanding and verbal consent obtained for treatment.  Affirm with the patient that the medications are taken as ordered. Patient expressed understanding of how their medications were to be used.   Confidentiality and exclusions reviewed with pt who verbalized understanding.   Laboratory:  none  Psychotherapy: Therapy: brief supportive therapy provided. Discussed psychosocial  stressors in detail.     Medications: pt declined medication a this time. States she would prefer therapy Vistaril prn as prescribe by PCP  Routine PRN Medications:  Yes  Consultations: refer to therapist  Safety Concerns:  Pt denies SI and is at an acute low risk for suicide.Patient told to call clinic if any problems occur. Patient advised to go to ER if they should develop SI/HI, side effects, or if symptoms worsen. Has crisis numbers to call if needed. Pt verbalized understanding.   Other:  F/up if needed     Oletta Darter, MD 2/11/20163:11 PM

## 2014-04-26 ENCOUNTER — Telehealth: Payer: Self-pay | Admitting: Certified Nurse Midwife

## 2014-04-26 DIAGNOSIS — Z3046 Encounter for surveillance of implantable subdermal contraceptive: Secondary | ICD-10-CM

## 2014-04-26 NOTE — Telephone Encounter (Signed)
Message left to return call to [facility] at [phone]. Removal order pended for patient return call.

## 2014-04-26 NOTE — Telephone Encounter (Signed)
Pt would like to have the nexplanon removed.

## 2014-04-27 ENCOUNTER — Ambulatory Visit (INDEPENDENT_AMBULATORY_CARE_PROVIDER_SITE_OTHER): Payer: 59 | Admitting: Certified Nurse Midwife

## 2014-04-27 ENCOUNTER — Encounter: Payer: Self-pay | Admitting: Certified Nurse Midwife

## 2014-04-27 VITALS — BP 108/74 | HR 68 | Resp 16 | Ht 64.25 in | Wt 157.0 lb

## 2014-04-27 DIAGNOSIS — Z308 Encounter for other contraceptive management: Secondary | ICD-10-CM

## 2014-04-27 DIAGNOSIS — Z30011 Encounter for initial prescription of contraceptive pills: Secondary | ICD-10-CM

## 2014-04-27 DIAGNOSIS — Z3046 Encounter for surveillance of implantable subdermal contraceptive: Secondary | ICD-10-CM

## 2014-04-27 MED ORDER — NORETHIN ACE-ETH ESTRAD-FE 1-20 MG-MCG(24) PO TABS: 1.0000 | ORAL_TABLET | Freq: Every day | ORAL | Status: DC

## 2014-04-27 NOTE — Telephone Encounter (Signed)
Spoke with patient. She states she really would like to have nexplanon removed as soon as possible. She declines appointment to discuss with provider. She states she is experiencing increased depression and anxiety and feels these issues are r/t to the nexplanon. She requests appointment as soon possible for removal. Ordered for precert and advised if out of pocket costs for procedure she would be notified. Appointment today scheduled with Verner Choleborah S. [name] CNM.

## 2014-04-27 NOTE — Patient Instructions (Signed)
Oral Contraception Use Oral contraceptive pills (OCPs) are medicines taken to prevent pregnancy. OCPs work by preventing the ovaries from releasing eggs. The hormones in OCPs also cause the cervical mucus to thicken, preventing the sperm from entering the uterus. The hormones also cause the uterine lining to become thin, not allowing a fertilized egg to attach to the inside of the uterus. OCPs are highly effective when taken exactly as prescribed. However, OCPs do not prevent sexually transmitted diseases (STDs). Safe sex practices, such as using condoms along with an OCP, can help prevent STDs. Before taking OCPs, you may have a physical exam and Pap test. Your health care provider may also order blood tests if necessary. Your health care provider will make sure you are a good candidate for oral contraception. Discuss with your health care provider the possible side effects of the OCP you may be prescribed. When starting an OCP, it can take 2 to 3 months for the body to adjust to the changes in hormone levels in your body.  HOW TO TAKE ORAL CONTRACEPTIVE PILLS Your health care provider may advise you on how to start taking the first cycle of OCPs. Otherwise, you can:   Start on day 1 of your menstrual period. You will not need any backup contraceptive protection with this start time.   Start on the first Sunday after your menstrual period or the day you get your prescription. In these cases, you will need to use backup contraceptive protection for the first week.   Start the pill at any time of your cycle. If you take the pill within 5 days of the start of your period, you are protected against pregnancy right away. In this case, you will not need a backup form of birth control. If you start at any other time of your menstrual cycle, you will need to use another form of birth control for 7 days. If your OCP is the type called a minipill, it will protect you from pregnancy after taking it for 2 days (48  hours). After you have started taking OCPs:   If you forget to take 1 pill, take it as soon as you remember. Take the next pill at the regular time.   If you miss 2 or more pills, call your health care provider because different pills have different instructions for missed doses. Use backup birth control until your next menstrual period starts.   If you use a 28-day pack that contains inactive pills and you miss 1 of the last 7 pills (pills with no hormones), it will not matter. Throw away the rest of the non-hormone pills and start a new pill pack.  No matter which day you start the OCP, you will always start a new pack on that same day of the week. Have an extra pack of OCPs and a backup contraceptive method available in case you miss some pills or lose your OCP pack.  HOME CARE INSTRUCTIONS   Do not smoke.   Always use a condom to protect against STDs. OCPs do not protect against STDs.   Use a calendar to mark your menstrual period days.   Read the information and directions that came with your OCP. Talk to your health care provider if you have questions.  SEEK MEDICAL CARE IF:   You develop nausea and vomiting.   You have abnormal vaginal discharge or bleeding.   You develop a rash.   You miss your menstrual period.   You are losing   your hair.   You need treatment for mood swings or depression.   You get dizzy when taking the OCP.   You develop acne from taking the OCP.   You become pregnant.  SEEK IMMEDIATE MEDICAL CARE IF:   You develop chest pain.   You develop shortness of breath.   You have an uncontrolled or severe headache.   You develop numbness or slurred speech.   You develop visual problems.   You develop pain, redness, and swelling in the legs.  Document Released: 02/15/2011 Document Revised: 07/13/2013 Document Reviewed: 08/17/2012 ExitCare Patient Information 2015 [employer]. This information is not intended to replace  advice given to you by your health care provider. Make sure you discuss any questions you have with your health care provider.  

## 2014-04-27 NOTE — Progress Notes (Signed)
29 yrsAfrican American Single female [id number]  presents for Nexplanon removal.   LMP2/10/16. Patient feels that she has had consistent depression and anxiety that does not change even with medication use since insertion.  oral contraceptives planned. Questions addressed.  Consent signed. Requests same as above.  HPI neg.  Exam: Healthy female WDWN Affect normal, orientation x 3  Procedure: Patient placed supine on exam table with herleft arm   flexed at the elbow and externally rotated.The insertion site was identified on the inner side of upper arm.The device was palpated. Incision site for removal was marked. The removal site was cleansed with betadine solution and 1 ml of 1% Lidocaine was injected at the incision site. A small 2 mm incision was made at the distal end of the implant.The/Nexaplon implant was grasped with curved forceps and removed gently intact.The implant was shown to the patient and discarded. The incision was closed with a steri strip.  No active bleeding was noted. A small adhesive bandage placed over the removal site.  A pressure bandage was place over the site.  Assessment:  Nexplanon Removal Pt tolerated procedure well. Planned contraception:OCP Plan :Instructions and warning signs and symptoms given.  Remove bandage in 3 days. Questions addressed Rx Loestrin 24 Fe start first day of next period, condom use during first cycle of OCP.   Return Visit aex 3/16

## 2014-05-02 NOTE — Progress Notes (Signed)
Reviewed personally.  M. Suzanne Dewana Ammirati, MD.  

## 2014-05-10 ENCOUNTER — Ambulatory Visit (INDEPENDENT_AMBULATORY_CARE_PROVIDER_SITE_OTHER): Payer: 59 | Admitting: Clinical

## 2014-05-10 ENCOUNTER — Encounter (HOSPITAL_COMMUNITY): Payer: Self-pay | Admitting: Clinical

## 2014-05-10 DIAGNOSIS — F410 Panic disorder [episodic paroxysmal anxiety] without agoraphobia: Secondary | ICD-10-CM | POA: Diagnosis not present

## 2014-05-10 NOTE — Progress Notes (Signed)
Patient:   Analyssa Craig   DOB:   04-03-1985  MR Number:  [id number]  Location:  [facility] BEHAVIORAL HEALTH OUTPATIENT THERAPY [place] [id number] [place] [address] Dept: [phone]           Date of Service:   05/10/2014  Start Time:   9:03 End Time:   10:00  Provider/Observer:  Erby Pian Counselor       Billing Code/Service: [id number]  Behavioral Observation: Diane Craig  presents as a 29 y.o.-year-old African American Female who appeared her stated age. her dress was Appropriate and she was Casual and Neat and her manners were Appropriate to the situation.  There were not any physical disabilities noted.  she displayed an appropriate level of cooperation and motivation.    Interactions:    Active   Attention:   normal  Memory:   normal  Speech (Volume):  normal  Speech:   normal pitch and normal volume  Thought Process:  Coherent and Relevant  Though Content:  WNL  Orientation:   person, place, time/date and situation  Judgment:   Fair  Planning:   Fair  Affect:    Appropriate  Mood:    Anxious  Insight:   Good  Intelligence:   normal  Chief Complaint:     Chief Complaint  Patient presents with  . Anxiety  . Depression    Reason for Service:  Self referred  Current Symptoms:  Anxiety and panic attacks. "On Dec 31 st went to emergency room because I was having trouble breathing, I checked out okay. 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 "I  would be done this semester but I had to drop my classes because of the anxiety. I am hoping to finish in the Spring Semester."  Legal History:  N/A  Military Experience:  N/A   Religious/Spiritual Preferences:  Christian    Family/Childhood History:                            Grew up in [place] - "It was me, My Mother and Step Father. I think I had a great childhood. I always did good and loved school." She lived with her parents until she was 68 and then attended [employer] and lived on [address]."  "I knew boyfriend since middle school. We were each others first boyfriend/ girlfriend. We of course went our separate ways until we reconnected in my sophomore year. He is good to me. He is the best emotional support that I have."  "We have been together 6 years and had a child two years ago." Daughter - Nyla. "We live together in [place]. 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 "I  would be done this semester but I had to drop my classes because of the anxiety. I am hoping to finish in the Spring Semester."      Natural/Informal Support:                           Boyfriend - Demario  Substance Use:  No concerns of substance abuse are reported.     Medical History:   Past Medical History  Diagnosis Date  . Abnormal Pap smear 2009    COLPO  LAST PAP 03/2011  . Infection     YEAST X 1  . Preterm delivery without spontaneous labor 01/09/2012    Induction for Preeclampsia  . Depo-Provera contraceptive status   . 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 Anxiety   . Depression           Medication List       This list is accurate as of: 05/10/14  9:17 AM.  Always use your most recent med list.               hydrOXYzine 25 MG capsule  Commonly known as:  VISTARIL  Take 1/2- 1 tablet every 8 hours as needed for anxiety     Norethindrone Acetate-Ethinyl  Estrad-FE 1-20 MG-MCG(24) tablet  Commonly known as:  LOESTRIN 24 FE  Take 1 tablet by mouth daily.     PROAIR HFA 108 (90 BASE) MCG/ACT inhaler  Generic drug:  albuterol  Inhale 2 puffs into the lungs every 6 (six) hours as needed.     UNABLE TO FIND  Med Name: Calm Aid (otc med)- 1 capsule daily supposed to reduce stress and tension- active ingredient is canola oil              Sexual History:   History  Sexual Activity  . Sexual Activity:  . Partners: Male  . Birth Control/ Protection: None     Abuse/Trauma History: Denies any childhood abuse or neglect                                                 Denies any adult abuse or neglect   Psychiatric History:  Denies                                                  Mar 11, 2014 and January 1st, 2016 Emergency Room - anxiety attacks                                                 1st time in Therapy  Strengths:   "I feel like my strength is just having strength being strong. I am a hard worker and a good Mother. I think just being able to be focused in a crazy world and being strong on own two feet instead of dependent on others."   Recovery Goals:  "Is to get me back to my normal self, it would be nice if we had a button that could erase the memories of anxiety,  the fear  of travel, and make it so I am not scared of being home by myself. I want to feel like myself again."  Hobbies/Interests:               " Shopping, eating, reading and just travel around."   Challenges/Barriers: "Biggest challenge is my thoughts, turning the bad ones off so I can see the big picture."   Family Med/Psych History:  Family History  Problem Relation Age of Onset  . Drug abuse Maternal Grandmother   . Anxiety disorder Neg Hx   . Bipolar disorder Neg Hx   . Depression Neg Hx   . Alcohol abuse Neg Hx   . Suicidality Neg Hx     Risk of Suicide/Violence: low Denies any current or past suicidal or homicidal ideation History of  Suicide/Violence:  N/A  Psychosis:   N/A  Diagnosis:    Panic disorder  Impression/DX: Diane Craig is  a 29 y.o.-year-old African American Female who presents with panic disorder. She began experiencing unexpected panic attacks Dec 30th, 2015. She reported that she was experiencing shortness of breath and went to the emergency room. She reported that she returned to the emergency room on January 1st because she was not feeling like herself. She reported that she was told she was having a panic attack. She reported that she has no less than 6 of them since then. She reported that during an attack she experiences, shortness of breath (can't catch my breath), tightness in chest, fear of going crazy, fear that she wont be able to completely calm down, fear of dying, fear of harming herself or others. "I didn't want the thoughts, they were disturbing." "Recently, I am having difficulty trying to control the worry."   Diane Craig had to withdrawal from this semester of school due to that anxiety and panic attacks. Diane Craig shared that she currently does not like to be home alone and has trouble traveling due to the attacks    Recommendation/Plan: Individual therapy 1x a week, follow safety plan as needed.

## 2014-05-18 ENCOUNTER — Ambulatory Visit (INDEPENDENT_AMBULATORY_CARE_PROVIDER_SITE_OTHER): Payer: 59 | Admitting: Certified Nurse Midwife

## 2014-05-18 ENCOUNTER — Encounter: Payer: Self-pay | Admitting: Certified Nurse Midwife

## 2014-05-18 VITALS — BP 90/60 | HR 80 | Resp 18 | Ht 64.75 in | Wt 154.4 lb

## 2014-05-18 DIAGNOSIS — N390 Urinary tract infection, site not specified: Secondary | ICD-10-CM

## 2014-05-18 DIAGNOSIS — Z124 Encounter for screening for malignant neoplasm of cervix: Secondary | ICD-10-CM | POA: Diagnosis not present

## 2014-05-18 DIAGNOSIS — Z0000 Encounter for general adult medical examination without abnormal findings: Secondary | ICD-10-CM | POA: Diagnosis not present

## 2014-05-18 DIAGNOSIS — Z01419 Encounter for gynecological examination (general) (routine) without abnormal findings: Secondary | ICD-10-CM

## 2014-05-18 DIAGNOSIS — Z30011 Encounter for initial prescription of contraceptive pills: Secondary | ICD-10-CM | POA: Diagnosis not present

## 2014-05-18 LAB — POCT URINALYSIS DIPSTICK
PH UA: 5
Urobilinogen, UA: NEGATIVE

## 2014-05-18 LAB — HIV ANTIBODY (ROUTINE TESTING W REFLEX): HIV: NONREACTIVE

## 2014-05-18 LAB — RPR

## 2014-05-18 LAB — HEMOGLOBIN, FINGERSTICK: HEMOGLOBIN, FINGERSTICK: 12 g/dL (ref 12.0–16.0)

## 2014-05-18 MED ORDER — NORETHIN ACE-ETH ESTRAD-FE 1-20 MG-MCG(24) PO TABS: 1.0000 | ORAL_TABLET | Freq: Every day | ORAL | Status: DC

## 2014-05-18 NOTE — Patient Instructions (Signed)
General topics  Next pap or exam is  due in 1 year Take a Women's multivitamin Take 1200 mg. of calcium daily - prefer dietary If any concerns in interim to call back  Breast Self-Awareness Practicing breast self-awareness may pick up problems early, prevent significant medical complications, and possibly save your life. By practicing breast self-awareness, you can become familiar with how your breasts look and feel and if your breasts are changing. This allows you to notice changes early. It can also offer you some reassurance that your breast health is good. One way to learn what is normal for your breasts and whether your breasts are changing is to do a breast self-exam. If you find a lump or something that was not present in the past, it is best to contact your caregiver right away. Other findings that should be evaluated by your caregiver include nipple discharge, especially if it is bloody; skin changes or reddening; areas where the skin seems to be pulled in (retracted); or new lumps and bumps. Breast pain is seldom associated with cancer (malignancy), but should also be evaluated by a caregiver. BREAST SELF-EXAM The best time to examine your breasts is 5 7 days after your menstrual period is over.  ExitCare Patient Information 2013 [employer].   Exercise to Stay Healthy Exercise helps you become and stay healthy. EXERCISE IDEAS AND TIPS Choose exercises that:  You enjoy.  Fit into your day. You do not need to exercise really hard to be healthy. You can do exercises at a slow or medium level and stay healthy. You can:  Stretch before and after working out.  Try yoga, Pilates, or tai chi.  Lift weights.  Walk fast, swim, jog, run, climb stairs, bicycle, dance, or rollerskate.  Take aerobic classes. Exercises that burn about 150 calories:  Running 1  miles in 15 minutes.  Playing volleyball for 45 to 60 minutes.  Washing and waxing a car for 45 to 60  minutes.  Playing touch football for 45 minutes.  Walking 1  miles in 35 minutes.  Pushing a stroller 1  miles in 30 minutes.  Playing basketball for 30 minutes.  Raking leaves for 30 minutes.  Bicycling 5 miles in 30 minutes.  Walking 2 miles in 30 minutes.  Dancing for 30 minutes.  Shoveling snow for 15 minutes.  Swimming laps for 20 minutes.  Walking up stairs for 15 minutes.  Bicycling 4 miles in 15 minutes.  Gardening for 30 to 45 minutes.  Jumping rope for 15 minutes.  Washing windows or floors for 45 to 60 minutes. Document Released: 03/31/2010 Document Revised: 05/21/2011 Document Reviewed: 03/31/2010 ExitCare Patient Information 2013 [employer].   Other topics ( that may be useful information):    Sexually Transmitted Disease Sexually transmitted disease (STD) refers to any infection that is passed from person to person during sexual activity. This may happen by way of saliva, semen, blood, vaginal mucus, or urine. Common STDs include:  Gonorrhea.  Chlamydia.  Syphilis.  HIV/AIDS.  Genital herpes.  Hepatitis B and C.  Trichomonas.  Human papillomavirus (HPV).  Pubic lice. CAUSES  An STD may be spread by bacteria, virus, or parasite. A person can get an STD by:  Sexual intercourse with an infected person.  Sharing sex toys with an infected person.  Sharing needles with an infected person.  Having intimate contact with the genitals, mouth, or rectal areas of an infected person. SYMPTOMS  Some people may not have any symptoms, but   they can still pass the infection to others. Different STDs have different symptoms. Symptoms include:  Painful or bloody urination.  Pain in the pelvis, abdomen, vagina, anus, throat, or eyes.  Skin rash, itching, irritation, growths, or sores (lesions). These usually occur in the genital or anal area.  Abnormal vaginal discharge.  Penile discharge in men.  Soft, flesh-colored skin growths in the  genital or anal area.  Fever.  Pain or bleeding during sexual intercourse.  Swollen glands in the groin area.  Yellow skin and eyes (jaundice). This is seen with hepatitis. DIAGNOSIS  To make a diagnosis, your caregiver may:  Take a medical history.  Perform a physical exam.  Take a specimen (culture) to be examined.  Examine a sample of discharge under a microscope.  Perform blood test TREATMENT   Chlamydia, gonorrhea, trichomonas, and syphilis can be cured with antibiotic medicine.  Genital herpes, hepatitis, and HIV can be treated, but not cured, with prescribed medicines. The medicines will lessen the symptoms.  Genital warts from HPV can be treated with medicine or by freezing, burning (electrocautery), or surgery. Warts may come back.  HPV is a virus and cannot be cured with medicine or surgery.However, abnormal areas may be followed very closely by your caregiver and may be removed from the cervix, vagina, or vulva through office procedures or surgery. If your diagnosis is confirmed, your recent sexual partners need treatment. This is true even if they are symptom-free or have a negative culture or evaluation. They should not have sex until their caregiver says it is okay. HOME CARE INSTRUCTIONS  All sexual partners should be informed, tested, and treated for all STDs.  Take your antibiotics as directed. Finish them even if you start to feel better.  Only take over-the-counter or prescription medicines for pain, discomfort, or fever as directed by your caregiver.  Rest.  Eat a balanced diet and drink enough fluids to keep your urine clear or pale yellow.  Do not have sex until treatment is completed and you have followed up with your caregiver. STDs should be checked after treatment.  Keep all follow-up appointments, Pap tests, and blood tests as directed by your caregiver.  Only use latex condoms and water-soluble lubricants during sexual activity. Do not use  petroleum jelly or oils.  Avoid alcohol and illegal drugs.  Get vaccinated for HPV and hepatitis. If you have not received these vaccines in the past, talk to your caregiver about whether one or both might be right for you.  Avoid risky sex practices that can break the skin. The only way to avoid getting an STD is to avoid all sexual activity.Latex condoms and dental dams (for oral sex) will help lessen the risk of getting an STD, but will not completely eliminate the risk. SEEK MEDICAL CARE IF:   You have a fever.  You have any new or worsening symptoms. Document Released: 05/19/2002 Document Revised: 05/21/2011 Document Reviewed: 05/26/2010 [facility] Patient Information 2013 [employer].    Domestic Abuse You are being battered or abused if someone close to you hits, pushes, or physically hurts you in any way. You also are being abused if you are forced into activities. You are being sexually abused if you are forced to have sexual contact of any kind. You are being emotionally abused if you are made to feel worthless or if you are constantly threatened. It is important to remember that help is available. No one has the right to abuse you. PREVENTION OF FURTHER  ABUSE  Learn the warning signs of danger. This varies with situations but may include: the use of alcohol, threats, isolation from friends and family, or forced sexual contact. Leave if you feel that violence is going to occur.  If you are attacked or beaten, report it to the police so the abuse is documented. You do not have to press charges. The police can protect you while you or the attackers are leaving. Get the officer's name and badge number and a copy of the report.  Find someone you can trust and tell them what is happening to you: your caregiver, a nurse, clergy member, close friend or family member. Feeling ashamed is natural, but remember that you have done nothing wrong. No one deserves abuse. Document Released:  02/24/2000 Document Revised: 05/21/2011 Document Reviewed: 05/04/2010 ExitCare Patient Information 2013 [employer].    How Much is Too Much Alcohol? Drinking too much alcohol can cause injury, accidents, and health problems. These types of problems can include:   Car crashes.  Falls.  Family fighting (domestic violence).  Drowning.  Fights.  Injuries.  Burns.  Damage to certain organs.  Having a baby with birth defects. ONE DRINK CAN BE TOO MUCH WHEN YOU ARE:  Working.  Pregnant or breastfeeding.  Taking medicines. Ask your doctor.  Driving or planning to drive. If you or someone you know has a drinking problem, get help from a doctor.  Document Released: 12/23/2008 Document Revised: 05/21/2011 Document Reviewed: 12/23/2008 ExitCare Patient Information 2013 [employer].   Smoking Hazards Smoking cigarettes is extremely bad for your health. Tobacco smoke has over 200 known poisons in it. There are over 60 chemicals in tobacco smoke that cause cancer. Some of the chemicals found in cigarette smoke include:   Cyanide.  Benzene.  Formaldehyde.  Methanol (wood alcohol).  Acetylene (fuel used in welding torches).  Ammonia. Cigarette smoke also contains the poisonous gases nitrogen oxide and carbon monoxide.  Cigarette smokers have an increased risk of many serious medical problems and Smoking causes approximately:  90% of all lung cancer deaths in men.  80% of all lung cancer deaths in women.  90% of deaths from chronic obstructive lung disease. Compared with nonsmokers, smoking increases the risk of:  Coronary heart disease by 2 to 4 times.  Stroke by 2 to 4 times.  Men developing lung cancer by 23 times.  Women developing lung cancer by 13 times.  Dying from chronic obstructive lung diseases by 12 times.  . Smoking is the most preventable cause of death and disease in our society.  WHY IS SMOKING ADDICTIVE?  Nicotine is the chemical  agent in tobacco that is capable of causing addiction or dependence.  When you smoke and inhale, nicotine is absorbed rapidly into the bloodstream through your lungs. Nicotine absorbed through the lungs is capable of creating a powerful addiction. Both inhaled and non-inhaled nicotine may be addictive.  Addiction studies of cigarettes and spit tobacco show that addiction to nicotine occurs mainly during the teen years, when young people begin using tobacco products. WHAT ARE THE BENEFITS OF QUITTING?  There are many health benefits to quitting smoking.   Likelihood of developing cancer and heart disease decreases. Health improvements are seen almost immediately.  Blood pressure, pulse rate, and breathing patterns start returning to normal soon after quitting. QUITTING SMOKING   American Lung Association - 1-800-LUNGUSA  American Cancer Society - 1-800-ACS-2345 Document Released: 04/05/2004 Document Revised: 05/21/2011 Document Reviewed: 12/08/2008 ExitCare Patient Information 2013 ExitCare[employer]   LLC.   Stress Management Stress is a state of physical or mental tension that often results from changes in your life or normal routine. Some common causes of stress are:  Death of a loved one.  Injuries or severe illnesses.  Getting fired or changing jobs.  Moving into a new home. Other causes may be:  Sexual problems.  Business or financial losses.  Taking on a large debt.  Regular conflict with someone at home or at work.  Constant tiredness from lack of sleep. It is not just bad things that are stressful. It may be stressful to:  Win the lottery.  Get married.  Buy a new car. The amount of stress that can be easily tolerated varies from person to person. Changes generally cause stress, regardless of the types of change. Too much stress can affect your health. It may lead to physical or emotional problems. Too little stress (boredom) may also become stressful. SUGGESTIONS TO  REDUCE STRESS:  Talk things over with your family and friends. It often is helpful to share your concerns and worries. If you feel your problem is serious, you may want to get help from a professional counselor.  Consider your problems one at a time instead of lumping them all together. Trying to take care of everything at once may seem impossible. List all the things you need to do and then start with the most important one. Set a goal to accomplish 2 or 3 things each day. If you expect to do too many in a single day you will naturally fail, causing you to feel even more stressed.  Do not use alcohol or drugs to relieve stress. Although you may feel better for a short time, they do not remove the problems that caused the stress. They can also be habit forming.  Exercise regularly - at least 3 times per week. Physical exercise can help to relieve that "uptight" feeling and will relax you.  The shortest distance between despair and hope is often a good night's sleep.  Go to bed and get up on time allowing yourself time for appointments without being rushed.  Take a short "time-out" period from any stressful situation that occurs during the day. Close your eyes and take some deep breaths. Starting with the muscles in your face, tense them, hold it for a few seconds, then relax. Repeat this with the muscles in your neck, shoulders, hand, stomach, back and legs.  Take good care of yourself. Eat a balanced diet and get plenty of rest.  Schedule time for having fun. Take a break from your daily routine to relax. HOME CARE INSTRUCTIONS   Call if you feel overwhelmed by your problems and feel you can no longer manage them on your own.  Return immediately if you feel like hurting yourself or someone else. Document Released: 08/22/2000 Document Revised: 05/21/2011 Document Reviewed: 04/14/2007 ExitCare Patient Information 2013 [employer].   

## 2014-05-18 NOTE — Progress Notes (Signed)
29 y.o. G1P0101 Single  African American Fe here for annual exam. Recent removal of Nexplanon. Depression and anxiety are much better. Had withdrawal bleeding after removal. Waiting for next period to occur. Condoms for contraception now without issues until cycle starts and then has Rx Loestrin 24 Fe to start. Denies urinary frequency, urgency or pain with urination or vaginal symptoms.  Sees PCP prn. Desires STD screening. No other health issues today.  Patient's last menstrual period was 04/22/2014.          Sexually active: Yes.    The current method of family planning is condoms all of the time.    Exercising: No.  The patient does not participate in regular exercise at present. Smoker:  no  Health Maintenance: Pap:  05/15/13 Negative TDaP:  01/10/12  Labs: Hgb: 12.0; Urine: WBC's ++ ; Trace RBC'S   reports that she has never smoked. She has never used smokeless tobacco. She reports that she drinks alcohol. She reports that she does not use illicit drugs.  Past Medical History  Diagnosis Date  . Abnormal Pap smear 2009    COLPO  LAST PAP 03/2011  . Infection     YEAST X 1  . Preterm delivery without spontaneous labor 01/09/2012    Induction for Preeclampsia  . Depo-Provera contraceptive status   . 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 Anxiety   . Depression     Past Surgical History  Procedure Laterality Date  . No past surgeries    . Colposcopy  2013    Current Outpatient Prescriptions  Medication Sig Dispense Refill  . hydrOXYzine (VISTARIL) 25 MG capsule Take 1/2- 1 tablet every 8 hours as needed for anxiety (Patient not taking: Reported on 05/18/2014) 30 capsule 0  . Norethindrone Acetate-Ethinyl Estrad-FE (LOESTRIN 24 FE) 1-20 MG-MCG(24) tablet Take 1 tablet by mouth daily. (Patient not taking: Reported on 05/10/2014) 1 Package 0  . PROAIR HFA 108 (90 BASE) MCG/ACT inhaler Inhale 2 puffs into the lungs every 6 (six) hours as needed.    Marland Kitchen UNABLE TO FIND Med Name:  Calm Aid (otc med)- 1 capsule daily supposed to reduce stress and tension- active ingredient is canola oil     No current facility-administered medications for this visit.    Family History  Problem Relation Age of Onset  . Drug abuse Maternal Grandmother   . Anxiety disorder Neg Hx   . Bipolar disorder Neg Hx   . Depression Neg Hx   . Alcohol abuse Neg Hx   . Suicidality Neg Hx     ROS:  Pertinent items are noted in HPI.  Otherwise, a comprehensive ROS was negative.  Exam:   BP 90/60 mmHg  Pulse 80  Resp 18  Ht 5' 4.75" (1.645 m)  Wt 154 lb 6.4 oz (70.035 kg)  BMI 25.88 kg/m2  LMP 04/22/2014 Height: 5' 4.75" (164.5 cm) Ht Readings from Last 3 Encounters:  05/18/14 5' 4.75" (1.645 m)  04/27/14 5' 4.25" (1.632 m)  04/22/14 5' 5.24" (1.657 m)    General appearance: alert, cooperative and appears stated age Head: Normocephalic, without obvious abnormality, atraumatic Neck: no adenopathy, supple, symmetrical, trachea midline and thyroid normal to inspection and palpation Lungs: clear to auscultation bilaterally CVAT bilateral negative Breasts: normal appearance, no masses or tenderness, No nipple retraction or dimpling, No nipple discharge or bleeding, No axillary or supraclavicular adenopathy Heart: regular rate and rhythm Abdomen: soft, non-tender; no masses,  no organomegaly, negative suprapubic Extremities: extremities normal, atraumatic,  no cyanosis or edema Skin: Skin color, texture, turgor normal. No rashes or lesions Numerous tattoos, warm and dry Lymph nodes: Cervical, supraclavicular, and axillary nodes normal. No abnormal inguinal nodes palpated Neurologic: Grossly normal   Pelvic: External genitalia:  no lesions              Urethra:  normal appearing urethra with no masses, tenderness or lesions  Bladder,urethral meatus non tender              Bartholin's and Skene's: normal                 Vagina: normal appearing vagina with normal color and discharge, no  lesions              Cervix: normal, non tender, no lesions              Pap taken: Yes.   Bimanual Exam:  Uterus:  normal size, contour, position, consistency, mobility, non-tender and anteverted              Adnexa: normal adnexa and no mass, fullness, tenderness               Rectovaginal: Confirms               Anus:  Normal appearance  Chaperone present: Yes  A:  Well Woman with normal exam  Contraception condoms/OCP to start with period  STD screening  R/O UTI RBC and WBC in urine  P:   Reviewed health and wellness pertinent to exam  Has RX for Loestrin 24 Fe  Labs: Gc,Chlamydia, HIV, RPR  Reviewed warning signs of UTI, increase daily water.  Lab Urine micro,culture  Pap smear taken today with HPV reflex   counseled on breast self exam, STD prevention, HIV risk factors and prevention, use and side effects of OCP's, adequate intake of calcium and vitamin D, diet and exercise  return annually or prn  An After Visit Summary was printed and given to the patient.

## 2014-05-19 ENCOUNTER — Ambulatory Visit (INDEPENDENT_AMBULATORY_CARE_PROVIDER_SITE_OTHER): Payer: PRIVATE HEALTH INSURANCE | Admitting: Clinical

## 2014-05-19 ENCOUNTER — Encounter (HOSPITAL_COMMUNITY): Payer: Self-pay | Admitting: Clinical

## 2014-05-19 DIAGNOSIS — F410 Panic disorder [episodic paroxysmal anxiety] without agoraphobia: Secondary | ICD-10-CM

## 2014-05-19 LAB — URINALYSIS, MICROSCOPIC ONLY
CASTS: NONE SEEN
Crystals: NONE SEEN

## 2014-05-19 LAB — URINE CULTURE
Colony Count: NO GROWTH
ORGANISM ID, BACTERIA: NO GROWTH

## 2014-05-19 NOTE — Progress Notes (Signed)
   THERAPIST PROGRESS NOTE  Session Time: 2:30 - 3:30  Participation Level: Active  Behavioral Response: Casual and NeatAlertNA  Type of Therapy: Individual Therapy  Treatment Goals addressed: improve psychiatric symptoms, emotional regulation, calming techniques  Interventions: Motivational Interviewing and Other: grounding and mindfulness techniques  Summary: Diane Craig is a 29 y.o. female who presents with Panic Disorder.   Suicidal/Homicidal: Nowithout intent/plan  Therapist Response:  [name] met with clinician for an individual session. Diane Craig discussed her psychiatric symptoms and her goal to get better. Diane Craig shared that she had been experiencing a lot of anxiety lately but did not have another panic attack this past week. Diane Craig discussed her symptoms and the information that she had learned about panic disorder. Clinician introduced grounding and mindfulness techniques. Diane Craig and clinician discussed the process and the purpose. Client and clinician discussed how the techniques would be used with cbt. Diane Craig and clinician practiced some of the techniques together. Diane Craig and clinician also discussed Diane Craig could work towards being present with her anxiety in the future. Diane Craig agreed to practice daily the techniques until next session.  Plan: Return again in1 week.  Diagnosis: Axis I: Panic Disorder       Wirt Hemmerich A, LCSW 05/19/2014

## 2014-05-19 NOTE — Progress Notes (Signed)
Reviewed personally.  M. Suzanne Haiden Rawlinson, MD.  

## 2014-05-20 LAB — IPS N GONORRHOEA AND CHLAMYDIA BY PCR

## 2014-05-20 LAB — IPS PAP TEST WITH REFLEX TO HPV

## 2014-05-26 ENCOUNTER — Ambulatory Visit (INDEPENDENT_AMBULATORY_CARE_PROVIDER_SITE_OTHER): Payer: PRIVATE HEALTH INSURANCE | Admitting: Clinical

## 2014-05-26 DIAGNOSIS — F410 Panic disorder [episodic paroxysmal anxiety] without agoraphobia: Secondary | ICD-10-CM | POA: Diagnosis not present

## 2014-05-28 ENCOUNTER — Encounter (HOSPITAL_COMMUNITY): Payer: Self-pay | Admitting: Clinical

## 2014-05-28 NOTE — Progress Notes (Signed)
   THERAPIST PROGRESS NOTE  Session Time: 2:35-3:35   Participation Level: Active  Behavioral Response: Casual and NeatAlertAnxious  Type of Therapy: Individual Therapy  Treatment Goals addressed: improve psychiatric symptoms, calming skills. Improve faulty thinking, emotional regulation skills  Interventions: CBT and Motivational Interviewing  Summary: Diane Craig is a 29 y.o. female who presents with Panic Disorder.   Suicidal/Homicidal: Nowithout intent/plan  Therapist Response:  [name] met with clinician for an individual session. [name] discussed her psychiatric symptoms, her current life events, and her homework.  She reported being able to do some of her homework but not all of it. Client and clinician discussed what kept her form completing her homework. [name] shared that it had been a challenging week for her. She reported that she was experiencing a lot of anxiety and had some crying spells. [name] shared her concern that she may need medication to help make her stable. Client and clinician discussed the possibility of medication to help support her mental health while working on her skills. [name] shared that she would like to try and do it without medication. Client and clinician discussed her fears and concerns. Clinician encouraged her to speak with the psychiatrist if her symptoms did not begin to improve. Client and clinician discussed [name]'s symptoms and negative thoughts. Clinician introduced a 7 panel thought record sheet. Client and clinician completed the worksheet together using [name]'s fears about her anxiety as an example. [name] identified and rated her emotions. She identified her negative automatic thoughts and client and clinician discussed the evidence that did and did not support those thoughts. [name] was then able to formulate healthier alternative thoughts and rated her negative emotions much lower.. Client and clinician discussed the process and  her thoughts and insight. [name] agreed to complete some cbt and some mindfulness homework prior to next session.  Plan: Return again in 1 weeks.  Diagnosis: Axis I: Panic Disorder       Aisea Bouldin A, LCSW 05/28/2014

## 2014-06-10 ENCOUNTER — Ambulatory Visit (HOSPITAL_COMMUNITY): Payer: Self-pay | Admitting: Clinical

## 2014-06-14 ENCOUNTER — Encounter: Payer: 59 | Admitting: Family Medicine

## 2014-06-14 ENCOUNTER — Encounter: Payer: Self-pay | Admitting: Family Medicine

## 2014-06-15 NOTE — Progress Notes (Signed)
This encounter was created in error - please disregard.

## 2014-06-21 ENCOUNTER — Encounter (HOSPITAL_COMMUNITY): Payer: Self-pay | Admitting: Clinical

## 2014-06-21 ENCOUNTER — Ambulatory Visit (INDEPENDENT_AMBULATORY_CARE_PROVIDER_SITE_OTHER): Payer: PRIVATE HEALTH INSURANCE | Admitting: Clinical

## 2014-06-21 DIAGNOSIS — F410 Panic disorder [episodic paroxysmal anxiety] without agoraphobia: Secondary | ICD-10-CM | POA: Diagnosis not present

## 2014-06-21 NOTE — Progress Notes (Signed)
   THERAPIST PROGRESS NOTE  Session Time: 3:33 -4:17  Participation Level: Active  Behavioral Response: Casual and NeatAlertNA  Type of Therapy: Individual Therapy  Treatment Goals addressed: improve psychiatric symptoms,  Improve faulty thinking, emotional regulation skills  Interventions: CBT and Motivational Interviewing  Summary: Diane Craig is a 29 y.o. female who presents with Panic Disorder.   Suicidal/Homicidal: No -without intent/plan  Therapist Response:  [name] met with clinician for an individual session. [name] discussed her psychiatric symptoms, her current life events, and her homework. [name] shared that while she has had a lot of anxiety she has not had another panic attack since last session. She reported that she has been doing her cbt homework. She shared that when she experiences anxiety she has been working to be mindful and present with it. She reports that she had not practiced her calming skills, siting that she does not have time. Client and clinician reviewed her panic cbt homework. [name] shared her thoughts and insights about the homework. [name] shared that the hardest part is the fear of having another Panic attack. She shared hat the thought of it makes her anxious. Client and clinician discussed the cost of focusing on the fear rather than being present in the moment. [name] agreed to continue her homework until next session  Plan: Return again in 1 weeks.  Diagnosis: Axis I: Panic Disorder    Dalphine Cowie A, LCSW 06/21/2014

## 2014-06-23 ENCOUNTER — Telehealth: Payer: Self-pay | Admitting: Certified Nurse Midwife

## 2014-06-23 NOTE — Telephone Encounter (Signed)
Left message regarding upcoming appointment has been canceled and needs to be rescheduled. °

## 2014-07-12 ENCOUNTER — Ambulatory Visit (INDEPENDENT_AMBULATORY_CARE_PROVIDER_SITE_OTHER): Payer: PRIVATE HEALTH INSURANCE | Admitting: Clinical

## 2014-07-12 ENCOUNTER — Encounter (HOSPITAL_COMMUNITY): Payer: Self-pay | Admitting: Clinical

## 2014-07-12 DIAGNOSIS — F410 Panic disorder [episodic paroxysmal anxiety] without agoraphobia: Secondary | ICD-10-CM | POA: Diagnosis not present

## 2014-07-12 NOTE — Progress Notes (Signed)
   THERAPIST PROGRESS NOTE  Session Time: 11:03 -12:02  Participation Level: Active  Behavioral Response: CasualAlertNA  Type of Therapy: Individual Therapy  Treatment Goals addressed: improve psychiatric symptoms, calming skills. Improve faulty thinking, emotional regulation skills  Interventions: CBT and Motivational Interviewing  Summary: Azariyah Luhrs is a 29 y.o. female who presents with Panic Disorder.   Suicidal/Homicidal: No -without intent/plan  Therapist Response:  Aftyn met with clinician for an individual session. Aliese discussed her psychiatric symptoms, her current life events, and her homework. Sherece shared her thoughts and insights about her homework. She shared that she was beginning to recognize how much her thoughts were shaping her experience. Aayliah shared that she is planning a trip to [place] ( where her panic attack first took place) she shared that she has been experiencing a lot of anxiety and worry due to her fears about having another attack. Client and clinician discussed her worst case scenarios. Aften was then able to recognize that there was no evidence to support the likelihood of them happening. Evynn shared her insight that her fear that she would "loose it" was very unlikely to happen. Client and clinician discussed how she could challenge her fears and also how to be mindful in the moment. Bari discussed in detail the negative automatic thoughts and her cbt practice. Lendy agreed to continue her homework until next session.  Plan: Return again in 2 weeks.  Diagnosis: Axis I: Panic Disorder    Sunil Hue A, LCSW 07/12/2014

## 2014-08-10 ENCOUNTER — Ambulatory Visit (HOSPITAL_COMMUNITY): Payer: Self-pay | Admitting: Clinical

## 2014-08-24 ENCOUNTER — Ambulatory Visit (INDEPENDENT_AMBULATORY_CARE_PROVIDER_SITE_OTHER): Payer: PRIVATE HEALTH INSURANCE | Admitting: Clinical

## 2014-08-24 ENCOUNTER — Encounter (HOSPITAL_COMMUNITY): Payer: Self-pay | Admitting: Clinical

## 2014-08-24 DIAGNOSIS — F410 Panic disorder [episodic paroxysmal anxiety] without agoraphobia: Secondary | ICD-10-CM

## 2014-08-24 NOTE — Progress Notes (Addendum)
   THERAPIST PROGRESS NOTE  Session Time: 11:08 -12:05  Participation Level: Active  Behavioral Response: CasualAlertNA  Type of Therapy: Individual Therapy  Treatment Goals addressed: improve psychiatric symptoms, calming skills. Improve faulty thinking, emotional regulation skills  Interventions: CBT and Motivational Interviewing  Summary: Diane Craig is a 29 y.o. female who presents with Panic Disorder.   Suicidal/Homicidal: No -without intent/plan  Therapist Response:  [name] met with clinician for an individual session. [name] discussed her psychiatric symptoms, her current life events, and her homework. [name] shared that her symptoms were improving. She shared that she continues to do her homework. Client and clincian discussed her grounding and mindfulness practice. [name] shared what was working for her as well as where she was having difficulty. Client and clinician discussed ways she could improve her practice as well as incorporate her personal preferences. [name] and clinician discussed her cbt homework. [name] shared that this was really helpful in helping her recognize her unhealthy thinking patterns. [name] and clinician discussed how to continue to challenge her thoughts. [name] shared she liked the idea of seeing panic attacks as a misfire. She shared that this helps her to just deal with the physical symptoms as physical symptoms rather than a sign that she was going to "loose it". [name] agreed to continue her homework until next session.   Plan: Return again in 2 weeks.  Diagnosis: Axis I: Panic Disorder    Nahmir Zeidman A, LCSW 08/24/2014

## 2014-09-07 ENCOUNTER — Ambulatory Visit (HOSPITAL_COMMUNITY): Payer: Self-pay | Admitting: Clinical

## 2014-09-21 ENCOUNTER — Ambulatory Visit (HOSPITAL_COMMUNITY): Payer: Self-pay | Admitting: Clinical

## 2014-10-05 ENCOUNTER — Ambulatory Visit (INDEPENDENT_AMBULATORY_CARE_PROVIDER_SITE_OTHER): Payer: PRIVATE HEALTH INSURANCE | Admitting: Clinical

## 2014-10-05 DIAGNOSIS — F410 Panic disorder [episodic paroxysmal anxiety] without agoraphobia: Secondary | ICD-10-CM | POA: Diagnosis not present

## 2014-10-07 ENCOUNTER — Encounter (HOSPITAL_COMMUNITY): Payer: Self-pay | Admitting: Clinical

## 2014-10-07 NOTE — Progress Notes (Signed)
   THERAPIST PROGRESS NOTE  Session Time: 11:10 - 11:45  Participation Level: Active  Behavioral Response: CasualAlertHappy  Type of Therapy: Individual Therapy  Treatment Goals addressed: improve psychiatric symptoms, calming skills. Improve faulty thinking, emotional regulation skills  Interventions: CBT and Motivational Interviewing  Summary: Mckinley Adelstein is a 29 y.o. female who presents with Panic Disorder.   Suicidal/Homicidal: No -without intent/plan  Therapist Response:  Sherell met with clinician for an individual session. Lawanna discussed her psychiatric symptoms and  her current life events. Langley reported that she is moving. She shared that she is making changes in her significant relationship and changing jobs. She shared event though all these high anxiety events are taking place Estle reported  That she has experienced some anxiety but no panic attacks since last session. She shared that she was able to be present with and address the anxiety as it occurred. She shares that she has been actively using her skills to address things as they arise rather than internalizing or avoiding. Tyronza and clinician reviewed and discussed the skills she has been using to improve her thoughts, to regulate her emotions, and to calm herself. Client and clinician discussed possible future situations and how Aubrei could successfully navigate them. [name] and clinician agreed that Xandria was ready to graduate from Therapy. Client and clinician discussed what she could do if she should need therapy again. She thanked clinician for all the help she received and Yexalen stated that she was very proud of her self and confident that she would do well.      Plan: Return again if needed  Diagnosis: Axis I: Panic Disorder   Quaran Kedzierski A, LCSW 10/07/2014

## 2014-10-19 ENCOUNTER — Ambulatory Visit (HOSPITAL_COMMUNITY): Payer: Self-pay | Admitting: Clinical

## 2015-03-22 ENCOUNTER — Telehealth: Payer: Self-pay | Admitting: Certified Nurse Midwife

## 2015-03-22 NOTE — Telephone Encounter (Signed)
Left message regarding upcoming appointment has been canceled and needs to be rescheduled. °

## 2015-05-23 ENCOUNTER — Ambulatory Visit: Payer: 59 | Admitting: Certified Nurse Midwife

## 2015-05-27 ENCOUNTER — Ambulatory Visit: Payer: 59 | Admitting: Certified Nurse Midwife

## 2015-12-08 ENCOUNTER — Encounter: Payer: Self-pay | Admitting: Certified Nurse Midwife

## 2015-12-08 ENCOUNTER — Ambulatory Visit (INDEPENDENT_AMBULATORY_CARE_PROVIDER_SITE_OTHER): Payer: BLUE CROSS/BLUE SHIELD | Admitting: Certified Nurse Midwife

## 2015-12-08 VITALS — BP 120/80 | HR 80 | Resp 16 | Ht 64.5 in | Wt 153.2 lb

## 2015-12-08 DIAGNOSIS — R319 Hematuria, unspecified: Secondary | ICD-10-CM | POA: Diagnosis not present

## 2015-12-08 DIAGNOSIS — Z01419 Encounter for gynecological examination (general) (routine) without abnormal findings: Secondary | ICD-10-CM

## 2015-12-08 DIAGNOSIS — Z862 Personal history of diseases of the blood and blood-forming organs and certain disorders involving the immune mechanism: Secondary | ICD-10-CM

## 2015-12-08 DIAGNOSIS — Z0000 Encounter for general adult medical examination without abnormal findings: Secondary | ICD-10-CM | POA: Diagnosis not present

## 2015-12-08 LAB — POCT URINALYSIS DIPSTICK
BILIRUBIN UA: NEGATIVE
Glucose, UA: NEGATIVE
Ketones, UA: NEGATIVE
Nitrite, UA: NEGATIVE
PH UA: 7
PROTEIN UA: NEGATIVE
Urobilinogen, UA: NEGATIVE

## 2015-12-08 LAB — CBC
HCT: 36.8 % (ref 35.0–45.0)
HEMOGLOBIN: 11.9 g/dL (ref 11.7–15.5)
MCH: 26.5 pg — ABNORMAL LOW (ref 27.0–33.0)
MCHC: 32.3 g/dL (ref 32.0–36.0)
MCV: 82 fL (ref 80.0–100.0)
MPV: 9.1 fL (ref 7.5–12.5)
Platelets: 240 10*3/uL (ref 140–400)
RBC: 4.49 MIL/uL (ref 3.80–5.10)
RDW: 13.7 % (ref 11.0–15.0)
WBC: 4.5 10*3/uL (ref 3.8–10.8)

## 2015-12-08 LAB — IRON: IRON: 43 ug/dL (ref 40–190)

## 2015-12-08 NOTE — Patient Instructions (Signed)
General topics  Next pap or exam is  due in 1 year Take a Women's multivitamin Take 1200 mg. of calcium daily - prefer dietary If any concerns in interim to call back  Breast Self-Awareness Practicing breast self-awareness may pick up problems early, prevent significant medical complications, and possibly save your life. By practicing breast self-awareness, you can become familiar with how your breasts look and feel and if your breasts are changing. This allows you to notice changes early. It can also offer you some reassurance that your breast health is good. One way to learn what is normal for your breasts and whether your breasts are changing is to do a breast self-exam. If you find a lump or something that was not present in the past, it is best to contact your caregiver right away. Other findings that should be evaluated by your caregiver include nipple discharge, especially if it is bloody; skin changes or reddening; areas where the skin seems to be pulled in (retracted); or new lumps and bumps. Breast pain is seldom associated with cancer (malignancy), but should also be evaluated by a caregiver. BREAST SELF-EXAM The best time to examine your breasts is 5 7 days after your menstrual period is over.  ExitCare Patient Information 2013 [place].   Exercise to Stay Healthy Exercise helps you become and stay healthy. EXERCISE IDEAS AND TIPS Choose exercises that:  You enjoy.  Fit into your day. You do not need to exercise really hard to be healthy. You can do exercises at a slow or medium level and stay healthy. You can:  Stretch before and after working out.  Try yoga, Pilates, or tai chi.  Lift weights.  Walk fast, swim, jog, run, climb stairs, bicycle, dance, or rollerskate.  Take aerobic classes. Exercises that burn about 150 calories:  Running 1  miles in 15 minutes.  Playing volleyball for 45 to 60 minutes.  Washing and waxing a car for 45 to 60  minutes.  Playing touch football for 45 minutes.  Walking 1  miles in 35 minutes.  Pushing a stroller 1  miles in 30 minutes.  Playing basketball for 30 minutes.  Raking leaves for 30 minutes.  Bicycling 5 miles in 30 minutes.  Walking 2 miles in 30 minutes.  Dancing for 30 minutes.  Shoveling snow for 15 minutes.  Swimming laps for 20 minutes.  Walking up stairs for 15 minutes.  Bicycling 4 miles in 15 minutes.  Gardening for 30 to 45 minutes.  Jumping rope for 15 minutes.  Washing windows or floors for 45 to 60 minutes. Document Released: 03/31/2010 Document Revised: 05/21/2011 Document Reviewed: 03/31/2010 [facility] Patient Information 2013 [place].   Other topics ( that may be useful information):    Sexually Transmitted Disease Sexually transmitted disease (STD) refers to any infection that is passed from person to person during sexual activity. This may happen by way of saliva, semen, blood, vaginal mucus, or urine. Common STDs include:  Gonorrhea.  Chlamydia.  Syphilis.  HIV/AIDS.  Genital herpes.  Hepatitis B and C.  Trichomonas.  Human papillomavirus (HPV).  Pubic lice. CAUSES  An STD may be spread by bacteria, virus, or parasite. A person can get an STD by:  Sexual intercourse with an infected person.  Sharing sex toys with an infected person.  Sharing needles with an infected person.  Having intimate contact with the genitals, mouth, or rectal areas of an infected person. SYMPTOMS  Some people may not have any symptoms, but  they can still pass the infection to others. Different STDs have different symptoms. Symptoms include:  Painful or bloody urination.  Pain in the pelvis, abdomen, vagina, anus, throat, or eyes.  Skin rash, itching, irritation, growths, or sores (lesions). These usually occur in the genital or anal area.  Abnormal vaginal discharge.  Penile discharge in men.  Soft, flesh-colored skin growths in the  genital or anal area.  Fever.  Pain or bleeding during sexual intercourse.  Swollen glands in the groin area.  Yellow skin and eyes (jaundice). This is seen with hepatitis. DIAGNOSIS  To make a diagnosis, your caregiver may:  Take a medical history.  Perform a physical exam.  Take a specimen (culture) to be examined.  Examine a sample of discharge under a microscope.  Perform blood test TREATMENT   Chlamydia, gonorrhea, trichomonas, and syphilis can be cured with antibiotic medicine.  Genital herpes, hepatitis, and HIV can be treated, but not cured, with prescribed medicines. The medicines will lessen the symptoms.  Genital warts from HPV can be treated with medicine or by freezing, burning (electrocautery), or surgery. Warts may come back.  HPV is a virus and cannot be cured with medicine or surgery.However, abnormal areas may be followed very closely by your caregiver and may be removed from the cervix, vagina, or vulva through office procedures or surgery. If your diagnosis is confirmed, your recent sexual partners need treatment. This is true even if they are symptom-free or have a negative culture or evaluation. They should not have sex until their caregiver says it is okay. HOME CARE INSTRUCTIONS  All sexual partners should be informed, tested, and treated for all STDs.  Take your antibiotics as directed. Finish them even if you start to feel better.  Only take over-the-counter or prescription medicines for pain, discomfort, or fever as directed by your caregiver.  Rest.  Eat a balanced diet and drink enough fluids to keep your urine clear or pale yellow.  Do not have sex until treatment is completed and you have followed up with your caregiver. STDs should be checked after treatment.  Keep all follow-up appointments, Pap tests, and blood tests as directed by your caregiver.  Only use latex condoms and water-soluble lubricants during sexual activity. Do not use  petroleum jelly or oils.  Avoid alcohol and illegal drugs.  Get vaccinated for HPV and hepatitis. If you have not received these vaccines in the past, talk to your caregiver about whether one or both might be right for you.  Avoid risky sex practices that can break the skin. The only way to avoid getting an STD is to avoid all sexual activity.Latex condoms and dental dams (for oral sex) will help lessen the risk of getting an STD, but will not completely eliminate the risk. SEEK MEDICAL CARE IF:   You have a fever.  You have any new or worsening symptoms. Document Released: 05/19/2002 Document Revised: 05/21/2011 Document Reviewed: 05/26/2010 [facility] Patient Information 2013 [place].    Domestic Abuse You are being battered or abused if someone close to you hits, pushes, or physically hurts you in any way. You also are being abused if you are forced into activities. You are being sexually abused if you are forced to have sexual contact of any kind. You are being emotionally abused if you are made to feel worthless or if you are constantly threatened. It is important to remember that help is available. No one has the right to abuse you. PREVENTION OF FURTHER  ABUSE  Learn the warning signs of danger. This varies with situations but may include: the use of alcohol, threats, isolation from friends and family, or forced sexual contact. Leave if you feel that violence is going to occur.  If you are attacked or beaten, report it to the police so the abuse is documented. You do not have to press charges. The police can protect you while you or the attackers are leaving. Get the officer's name and badge number and a copy of the report.  Find someone you can trust and tell them what is happening to you: your caregiver, a nurse, clergy member, close friend or family member. Feeling ashamed is natural, but remember that you have done nothing wrong. No one deserves abuse. Document Released:  02/24/2000 Document Revised: 05/21/2011 Document Reviewed: 05/04/2010 [facility] Patient Information 2013 [place].    How Much is Too Much Alcohol? Drinking too much alcohol can cause injury, accidents, and health problems. These types of problems can include:   Car crashes.  Falls.  Family fighting (domestic violence).  Drowning.  Fights.  Injuries.  Burns.  Damage to certain organs.  Having a baby with birth defects. ONE DRINK CAN BE TOO MUCH WHEN YOU ARE:  Working.  Pregnant or breastfeeding.  Taking medicines. Ask your doctor.  Driving or planning to drive. If you or someone you know has a drinking problem, get help from a doctor.  Document Released: 12/23/2008 Document Revised: 05/21/2011 Document Reviewed: 12/23/2008 [facility] Patient Information 2013 [place].   Smoking Hazards Smoking cigarettes is extremely bad for your health. Tobacco smoke has over 200 known poisons in it. There are over 60 chemicals in tobacco smoke that cause cancer. Some of the chemicals found in cigarette smoke include:   Cyanide.  Benzene.  Formaldehyde.  Methanol (wood alcohol).  Acetylene (fuel used in welding torches).  Ammonia. Cigarette smoke also contains the poisonous gases nitrogen oxide and carbon monoxide.  Cigarette smokers have an increased risk of many serious medical problems and Smoking causes approximately:  90% of all lung cancer deaths in men.  80% of all lung cancer deaths in women.  90% of deaths from chronic obstructive lung disease. Compared with nonsmokers, smoking increases the risk of:  Coronary heart disease by 2 to 4 times.  Stroke by 2 to 4 times.  Men developing lung cancer by 23 times.  Women developing lung cancer by 13 times.  Dying from chronic obstructive lung diseases by 12 times.  . Smoking is the most preventable cause of death and disease in our society.  WHY IS SMOKING ADDICTIVE?  Nicotine is the chemical  agent in tobacco that is capable of causing addiction or dependence.  When you smoke and inhale, nicotine is absorbed rapidly into the bloodstream through your lungs. Nicotine absorbed through the lungs is capable of creating a powerful addiction. Both inhaled and non-inhaled nicotine may be addictive.  Addiction studies of cigarettes and spit tobacco show that addiction to nicotine occurs mainly during the teen years, when young people begin using tobacco products. WHAT ARE THE BENEFITS OF QUITTING?  There are many health benefits to quitting smoking.   Likelihood of developing cancer and heart disease decreases. Health improvements are seen almost immediately.  Blood pressure, pulse rate, and breathing patterns start returning to normal soon after quitting. QUITTING SMOKING   American Lung Association - 1-800-LUNGUSA  American Cancer Society - 1-800-ACS-2345 Document Released: 04/05/2004 Document Revised: 05/21/2011 Document Reviewed: 12/08/2008 [facility] Patient Information 2013 [place],  [place].   Stress Management Stress is a state of physical or mental tension that often results from changes in your life or normal routine. Some common causes of stress are:  Death of a loved one.  Injuries or severe illnesses.  Getting fired or changing jobs.  Moving into a new home. Other causes may be:  Sexual problems.  Business or financial losses.  Taking on a large debt.  Regular conflict with someone at home or at work.  Constant tiredness from lack of sleep. It is not just bad things that are stressful. It may be stressful to:  Win the lottery.  Get married.  Buy a new car. The amount of stress that can be easily tolerated varies from person to person. Changes generally cause stress, regardless of the types of change. Too much stress can affect your health. It may lead to physical or emotional problems. Too little stress (boredom) may also become stressful. SUGGESTIONS TO  REDUCE STRESS:  Talk things over with your family and friends. It often is helpful to share your concerns and worries. If you feel your problem is serious, you may want to get help from a professional counselor.  Consider your problems one at a time instead of lumping them all together. Trying to take care of everything at once may seem impossible. List all the things you need to do and then start with the most important one. Set a goal to accomplish 2 or 3 things each day. If you expect to do too many in a single day you will naturally fail, causing you to feel even more stressed.  Do not use alcohol or drugs to relieve stress. Although you may feel better for a short time, they do not remove the problems that caused the stress. They can also be habit forming.  Exercise regularly - at least 3 times per week. Physical exercise can help to relieve that "uptight" feeling and will relax you.  The shortest distance between despair and hope is often a good night's sleep.  Go to bed and get up on time allowing yourself time for appointments without being rushed.  Take a short "time-out" period from any stressful situation that occurs during the day. Close your eyes and take some deep breaths. Starting with the muscles in your face, tense them, hold it for a few seconds, then relax. Repeat this with the muscles in your neck, shoulders, hand, stomach, back and legs.  Take good care of yourself. Eat a balanced diet and get plenty of rest.  Schedule time for having fun. Take a break from your daily routine to relax. HOME CARE INSTRUCTIONS   Call if you feel overwhelmed by your problems and feel you can no longer manage them on your own.  Return immediately if you feel like hurting yourself or someone else. Document Released: 08/22/2000 Document Revised: 05/21/2011 Document Reviewed: 04/14/2007 ExitCare Patient Information 2013 [employer].   

## 2015-12-08 NOTE — Progress Notes (Signed)
30 y.o. G1P0101 Single  African American Fe here for annual exam. Periods normal, no issues. Condoms working well for contraception, not currently sexually active. Desires STD screening, no concerns. No UTI symptoms or frequency or pain. No increase in vaginal discharge. Had been seeing counselor for panic attacks, has not had panic attack in more than one year!Diane Craig. Sees PCP yearly, prn. Working  now with [employer] and happy with career. No other health concerns today.   Patient's last menstrual period was 11/15/2015.          Sexually active: No.  The current method of family planning is condoms most of the time.    Exercising: Yes.    Walk Smoker:  no  Health Maintenance: Pap:  05/18/14 WNL  TDaP:  01/10/12 HIV: 05/18/14-neg Labs:    Urine: Trace blood, 1+ Leuko's Self Breast Exams: None   reports that she has never smoked. She has never used smokeless tobacco. She reports that she drinks alcohol. She reports that she does not use drugs.  Past Medical History:  Diagnosis Date  . Abnormal Pap smear 2009   COLPO  LAST PAP 03/2011  . Anxiety   . Depo-Provera contraceptive status   . Depression   . Infection    YEAST X 1  . 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 Preterm delivery without spontaneous labor 01/09/2012   Induction for Preeclampsia    Past Surgical History:  Procedure Laterality Date  . COLPOSCOPY  2013  . NO PAST SURGERIES      Current Outpatient Prescriptions  Medication Sig Dispense Refill  . ferrous sulfate 325 (65 FE) MG tablet Take 325 mg by mouth daily with breakfast.    . hydrOXYzine (VISTARIL) 25 MG capsule Take 1/2- 1 tablet every 8 hours as needed for anxiety 30 capsule 0   No current facility-administered medications for this visit.     Family History  Problem Relation Age of Onset  . Drug abuse Maternal Grandmother   . Anxiety disorder Neg Hx   . Bipolar disorder Neg Hx   . Depression Neg Hx   . Alcohol abuse Neg Hx   . Suicidality Neg Hx      ROS:  Pertinent items are noted in HPI.  Otherwise, a comprehensive ROS was negative.  Exam:   BP 120/80 (BP Location: Right Arm, Patient Position: Sitting, Cuff Size: Normal)   Pulse 80   Resp 16   Ht 5' 4.5" (1.638 m)   Wt 153 lb 3.2 oz (69.5 kg)   LMP 11/15/2015   BMI 25.89 kg/m  Height: 5' 4.5" (163.8 cm) Ht Readings from Last 3 Encounters:  12/08/15 5' 4.5" (1.638 m)  06/14/14 5\' 5"  (1.651 m)  05/18/14 5' 4.75" (1.645 m)    General appearance: alert, cooperative and appears stated age Head: Normocephalic, without obvious abnormality, atraumatic Neck: no adenopathy, supple, symmetrical, trachea midline and thyroid normal to inspection and palpation Lungs: clear to auscultation bilaterally Breasts: normal appearance, no masses or tenderness, No nipple retraction or dimpling,or discharge Heart: regular rate and rhythm Abdomen: soft, non-tender; no masses,  no organomegaly Extremities: extremities normal, atraumatic, no cyanosis or edema Skin: Skin color, texture, turgor normal. No rashes or lesions Lymph nodes: Cervical, supraclavicular, and axillary nodes normal. No abnormal inguinal nodes palpated Neurologic: Grossly normal   Pelvic: External genitalia:  no lesions              Urethra:  normal appearing urethra with no masses, tenderness or lesions  Bartholin's and Skene's: normal                 Vagina: normal appearing vagina with normal color and discharge, no lesions              Cervix: no cervical motion tenderness, no lesions and normal appearance              Pap taken: No. Bimanual Exam:  Uterus:  normal size, contour, position, consistency, mobility, non-tender              Adnexa: normal adnexa and no mass, fullness, tenderness               Rectovaginal: Confirms               Anus:  normal sphincter tone, no lesions  Chaperone present: yes  A:  Well Woman with normal exam  Contraception condoms if sexually active  Anemia history now on  iron with PCP management, would like checked today  Screening labs  Urine micro R/O UTI  P:   Reviewed health and wellness pertinent to exam  Continue iron as prescribed and follow up  Labs: RPR, HIV, CBC, IRON, Gc,Chlamydia,wet prep  Warning signs of UTI given.  Lab: Urine micro  Pap smear as above not taken   counseled on breast self exam, STD prevention, HIV risk factors and prevention, adequate intake of calcium and vitamin D, diet and exercise  return annually or prn   An After Visit Summary was printed and given to the patient.

## 2015-12-09 ENCOUNTER — Telehealth: Payer: Self-pay

## 2015-12-09 ENCOUNTER — Other Ambulatory Visit: Payer: Self-pay | Admitting: Certified Nurse Midwife

## 2015-12-09 DIAGNOSIS — B9689 Other specified bacterial agents as the cause of diseases classified elsewhere: Secondary | ICD-10-CM

## 2015-12-09 DIAGNOSIS — N760 Acute vaginitis: Principal | ICD-10-CM

## 2015-12-09 LAB — URINALYSIS, MICROSCOPIC ONLY
Bacteria, UA: NONE SEEN [HPF]
CRYSTALS: NONE SEEN [HPF]
Casts: NONE SEEN [LPF]
RBC / HPF: NONE SEEN RBC/HPF (ref <=2)
Yeast: NONE SEEN [HPF]

## 2015-12-09 LAB — WET PREP BY MOLECULAR PROBE
CANDIDA SPECIES: NEGATIVE
Gardnerella vaginalis: POSITIVE — AB
TRICHOMONAS VAG: NEGATIVE

## 2015-12-09 LAB — RPR

## 2015-12-09 LAB — HIV ANTIBODY (ROUTINE TESTING W REFLEX): HIV: NONREACTIVE

## 2015-12-09 MED ORDER — METRONIDAZOLE 0.75 % VA GEL: 1.0000 | Freq: Two times a day (BID) | VAGINAL | 0 refills | Status: DC

## 2015-12-09 NOTE — Progress Notes (Signed)
Encounter reviewed Jill Jertson, MD   

## 2015-12-09 NOTE — Telephone Encounter (Signed)
Patient notified of results. See lab 

## 2015-12-09 NOTE — Telephone Encounter (Signed)
lmtcb

## 2015-12-09 NOTE — Telephone Encounter (Signed)
-----   Message from Verner Choleborah S [name], CNM sent at 12/09/2015  7:52 AM EDT ----- Notify patient that RPR, HIV, are negative Iron level and CBC borderline normal, so iron supplement is working, continue as directed Urine micro negative, no infection noted Affirm negative for yeast and trichomonas, positive for BV  rx Metrogel placed, please give instructions GC,Ch[place]mydia pending

## 2015-12-12 LAB — IPS N GONORRHOEA AND CHLAMYDIA BY PCR

## 2016-01-26 ENCOUNTER — Telehealth: Payer: Self-pay | Admitting: *Deleted

## 2016-01-26 ENCOUNTER — Encounter: Payer: Self-pay | Admitting: Certified Nurse Midwife

## 2016-01-26 NOTE — Telephone Encounter (Signed)
Reply to patient via MyChart.   To: Stephani PoliceNatasha Abril    From: Leda MinJill N Liddy Deam, RN    Created: 01/26/2016 5:02 PM     Ms. [name],  I have reviewed with Leota Sauerseborah [name], CNM and she does recommend an office visit for birth control patch.   Sincerely, Carmelina DaneJill Marvell Stavola, RN

## 2016-01-26 NOTE — Telephone Encounter (Signed)
Yes she will with patch .

## 2016-01-26 NOTE — Telephone Encounter (Signed)
Diane Craig, CNM -patient requesting birth control patch. Last AEX 12/08/15 -condoms for contraception, no discussion of birth control -please advise?   From Stephani PoliceNatasha Craig To Verner Choleborah S Craig, CNM Sent 01/26/2016 2:51 PM  Hi Noreene LarssonJill,   Is an appointment necessary when I just saw her for a complete exam in September?      From Leda MinJill N Dom Haverland, RN To Stephani PoliceNatasha Craig Sent and Delivered 01/26/2016 2:46 PM  Last Read in MyChart 01/26/2016 2:50 PM by Stephani PoliceNatasha Glaus  Ms. [name],   Good afternoon. My name is Noreene LarssonJill, I am one of the triage nurses assisting with MyChart messages. Please call the office at [phone] to schedule an office visit with Diane Craig, CNM to discuss contraception options. Please let us know if we can be of any further assistance.   Sincerely,  Carmelina DaneJill Makenley Shimp, RN   From Stephani PoliceNatasha Craig To Verner Choleborah S Craig, CNM Sent 01/26/2016 2:12 PM  Hello Dr. Darcel BayleyLeonard,   Are you able to provide me a prescription for the birth control patch?

## 2016-01-27 NOTE — Telephone Encounter (Signed)
Spoke with patient at time of incoming call. Patient declines to schedule an appointment to discuss birth control patch. States "I feel this is a huge inconvenience to me when I was just seen in September. I do not understand why I need to come back in. I am sure she will prescribe it to me." Advised recommendations to return for an OV are directly from Leota Sauerseborah [name] CNM as she will need to discuss medication risks and options with the patient before starting on a new medication. Patient states "This could be done through the phone I don't need an office visit for that." Advised patient it is Leota SauersDeborah [name] CNM's recommendation that she be seen in the office to discuss. Patient again declines. Requesting I review again with Leota Sauerseborah [name] CNM. Advised patient I will review with Leota Sauerseborah [name] CNM, but that OV is likely still going to be recommended due to new start of medication. Patient verbalizes understanding.  Stephani PoliceNatasha Reigle  to Verner Choleborah S [name], CNM     01/26/16 5:06 PM  Is a e-visit an option at all for this? I just see it as a huge inconvenience for me to have to come in for a 15 min or less office visit to answer redundant questions that I've answered in September and pay another copay.

## 2016-01-27 NOTE — Telephone Encounter (Signed)
Still recommend OV

## 2016-01-30 NOTE — Telephone Encounter (Signed)
Spoke with patient. Advised of message as seen below from PepsiCoDeborah [name] CNM. Appointment scheduled for patient to see Leota SauersDeborah [name] CNM on 02/17/2016 at 4 pm. Patient is agreeable to date and time.  Routing to provider for final review. Patient agreeable to disposition. Will close encounter.

## 2016-02-10 ENCOUNTER — Encounter: Payer: Self-pay | Admitting: Certified Nurse Midwife

## 2016-02-10 ENCOUNTER — Telehealth: Payer: Self-pay | Admitting: Certified Nurse Midwife

## 2016-02-10 ENCOUNTER — Ambulatory Visit: Payer: BLUE CROSS/BLUE SHIELD | Admitting: Certified Nurse Midwife

## 2016-02-10 NOTE — Telephone Encounter (Signed)
Patient cancelled appointment for this afternoon. Says something came up and she can't make it. She is aware of dnka fee.

## 2016-02-17 ENCOUNTER — Telehealth: Payer: Self-pay | Admitting: Certified Nurse Midwife

## 2016-02-17 ENCOUNTER — Ambulatory Visit: Payer: BLUE CROSS/BLUE SHIELD | Admitting: Certified Nurse Midwife

## 2016-02-17 NOTE — Telephone Encounter (Signed)
Patient called and cancelled her birth control consult on 02/29/16. She said she does not plan on rescheduling at this time.  FYI only.

## 2016-02-28 NOTE — Telephone Encounter (Signed)
Okay to close encounter or is further follow up needed? °

## 2016-02-28 NOTE — Telephone Encounter (Signed)
Can close.

## 2016-02-29 ENCOUNTER — Ambulatory Visit: Payer: BLUE CROSS/BLUE SHIELD | Admitting: Certified Nurse Midwife

## 2016-08-27 ENCOUNTER — Encounter: Payer: Self-pay | Admitting: Certified Nurse Midwife

## 2016-08-27 ENCOUNTER — Telehealth: Payer: Self-pay

## 2016-08-27 MED ORDER — LEVONORGESTREL 1.5 MG PO TABS: 1.5000 mg | ORAL_TABLET | Freq: Once | ORAL | 0 refills | Status: AC

## 2016-08-27 NOTE — Telephone Encounter (Signed)
Spoke with patient. Patient states that her LMP was 6/5-6/9. Last had intercourse on 08/26/2016. Advised I have reviewed with Ria CommentPatricia Grubb, FNP and she is agreeable to writing rx for Plan B for insurance. Rx for Plan B One-Step 1.5 mg take 1 tablet by mouth once #1 0RF sent to pharmacy on file. Patient is aware she will need to take this as soon as possible and must take this within 72 hours of having unprotected intercourse. Aware this may cause her to have irregular light spotting/bleeding after taking the medication Will contact the office with any questions or concerns.  Routing to Ria CommentPatricia Grubb, FNP for review.

## 2016-08-27 NOTE — Telephone Encounter (Signed)
I understand that it is sold over the counter. However I was advised that if I would like my insurance to pay for it I would need a prescription.    ----- Message -----  From: Nurse Diane Craig  Sent: 08/27/16, 3:41 PM  To: Diane PoliceNatasha Craig  Subject: RE: Non-Urgent Medical Question    Diane SmilingNatasha,    Plan B (the morning after pill) is now sold over the counter. You do not need a prescription to take this medication. It is behind the counter at the pharmacy. You will just need to ask a pharmacy staff member to get it for you. It is important to know that this must be taken within 72 hours of having unprotected intercourse.    Sincerely,    Diane MuKaitlyn Willmar Stockinger, Diane Craig    ----- Message -----   From: Diane PoliceNatasha Craig   Sent: 08/27/2016 3:20 PM EDT    To: Diane SauersLEONARD,Diane Craig, Diane Craig  Subject: Non-Urgent Medical Question    Hello,    I would like to know if I can get a prescription sent to my pharmacy for the morning after pill?   Routing to Diane CommentPatricia Grubb, Diane Craig for review and advise as Diane SauersDeborah Diane Craig Diane Craig is out of the office today.

## 2016-08-27 NOTE — Telephone Encounter (Signed)
Left message to call Kaitlyn at [phone]. 

## 2016-10-16 NOTE — Telephone Encounter (Signed)
Dr.Miller, okay to close encounter? 

## 2016-10-16 NOTE — Telephone Encounter (Signed)
OK to close encounter. 

## 2016-12-11 ENCOUNTER — Ambulatory Visit (INDEPENDENT_AMBULATORY_CARE_PROVIDER_SITE_OTHER): Payer: BLUE CROSS/BLUE SHIELD | Admitting: Certified Nurse Midwife

## 2016-12-11 ENCOUNTER — Encounter: Payer: Self-pay | Admitting: Certified Nurse Midwife

## 2016-12-11 VITALS — BP 104/62 | HR 68 | Resp 16 | Ht 64.5 in | Wt 147.0 lb

## 2016-12-11 DIAGNOSIS — F419 Anxiety disorder, unspecified: Secondary | ICD-10-CM | POA: Insufficient documentation

## 2016-12-11 DIAGNOSIS — Z01419 Encounter for gynecological examination (general) (routine) without abnormal findings: Secondary | ICD-10-CM

## 2016-12-11 DIAGNOSIS — Z3045 Encounter for surveillance of transdermal patch hormonal contraceptive device: Secondary | ICD-10-CM

## 2016-12-11 MED ORDER — NORELGESTROMIN-ETH ESTRADIOL 150-35 MCG/24HR TD PTWK: 1.0000 | MEDICATED_PATCH | TRANSDERMAL | 4 refills | Status: DC

## 2016-12-11 NOTE — Patient Instructions (Signed)

## 2016-12-11 NOTE — Progress Notes (Signed)
31 y.o. G1P0101 Single  African American Fe here for annual exam. Periods normal, no issues. Contraception working well, no issues with adhesive of patch. See [facility] prn, no visits in past year. No partner change, no STD screening desired. Has been working on weight down 6 pounds. No more panic attacks, sees counselor prn now.  No health issues today.  Patient's last menstrual period was 12/07/2016 (exact date).          Sexually active: Yes.    The current method of family planning Ortho-Evra patches weekly.    Exercising: No.  exercise Smoker:  no  Health Maintenance: Pap:  05-18-14 neg History of Abnormal Pap: yes MMG:  none Self Breast exams: no Colonoscopy:  none BMD:   none TDaP:  2013 Shingles: no Pneumonia: no Hep C and HIV: HIV neg 2017 Labs: no   reports that she has never smoked. She has never used smokeless tobacco. She reports that she drinks about 0.6 - 1.2 oz of alcohol per week . She reports that she does not use drugs.  Past Medical History:  Diagnosis Date  . Abnormal Pap smear 2009   COLPO  LAST PAP 03/2011  . Anxiety   . Depo-Provera contraceptive status   . Depression   . Infection    YEAST X 1  . 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 Preterm delivery without spontaneous labor 01/09/2012   Induction for Preeclampsia    Past Surgical History:  Procedure Laterality Date  . COLPOSCOPY  2013  . NO PAST SURGERIES      Current Outpatient Prescriptions  Medication Sig Dispense Refill  . norelgestromin-ethinyl estradiol (ORTHO EVRA) 150-35 MCG/24HR transdermal patch Place 1 patch onto the skin once a week.     No current facility-administered medications for this visit.     Family History  Problem Relation Age of Onset  . Drug abuse Maternal Grandmother   . Anxiety disorder Neg Hx   . Bipolar disorder Neg Hx   . Depression Neg Hx   . Alcohol abuse Neg Hx   . Suicidality Neg Hx     ROS:  Pertinent items are noted in HPI.   Otherwise, a comprehensive ROS was negative.  Exam:   BP 104/62   Pulse 68   Resp 16   Ht 5' 4.5" (1.638 m)   Wt 147 lb (66.7 kg)   LMP 12/07/2016 (Exact Date)   BMI 24.84 kg/m  Height: 5' 4.5" (163.8 cm) Ht Readings from Last 3 Encounters:  12/11/16 5' 4.5" (1.638 m)  12/08/15 5' 4.5" (1.638 m)  06/14/14  (1.651 m)    General appearance: alert, cooperative and appears stated age Head: Normocephalic, without obvious abnormality, atraumatic Neck: no adenopathy, supple, symmetrical, trachea midline and thyroid normal to inspection and palpation Lungs: clear to auscultation bilaterally Breasts: normal appearance, no masses or tenderness, No nipple retraction or dimpling, No nipple discharge or bleeding, No axillary or supraclavicular adenopathy Heart: regular rate and rhythm Abdomen: soft, non-tender; no masses,  no organomegaly Extremities: extremities normal, atraumatic, no cyanosis or edema Skin: Skin color, texture, turgor normal. No rashes or lesions Lymph nodes: Cervical, supraclavicular, and axillary nodes normal. No abnormal inguinal nodes palpated Neurologic: Grossly normal   Pelvic: External genitalia:  no lesions              Urethra:  normal appearing urethra with no masses, tenderness or lesions  Bartholin's and Skene's: normal                 Vagina: normal appearing vagina with normal color and discharge, no lesions              Cervix: no cervical motion tenderness, no lesions and nulliparous appearance              Pap taken: No. Bimanual Exam:  Uterus:  normal size, contour, position, consistency, mobility, non-tender              Adnexa: normal adnexa and no mass, fullness, tenderness               Rectovaginal: Confirms               Anus:  normal appearance  Chaperone present: yes  A:  Well Woman with normal exam  Contraception Ortho Evra desired  Weight loss in progress, plans to be down to normal weight by next year  P:   Reviewed  health and wellness pertinent to exam  Risks and benefits of Ortho Evra , warning signs also.   Rx Ortho  Evra see order with instructions  Pap smear: no   counseled on breast self exam, STD prevention, HIV risk factors and prevention, adequate intake of calcium and vitamin D, diet and exercise  return annually or prn  An After Visit Summary was printed and given to the patient.

## 2017-01-24 ENCOUNTER — Other Ambulatory Visit: Payer: Self-pay | Admitting: Family Medicine

## 2017-01-24 DIAGNOSIS — Q678 Other congenital deformities of chest: Secondary | ICD-10-CM

## 2017-02-08 ENCOUNTER — Ambulatory Visit
Admission: RE | Admit: 2017-02-08 | Discharge: 2017-02-08 | Disposition: A | Discharge status: Home / Self Care | Payer: BLUE CROSS/BLUE SHIELD | Source: Ambulatory Visit | Attending: Family Medicine | Admitting: Family Medicine

## 2017-02-08 ENCOUNTER — Other Ambulatory Visit: Payer: Self-pay | Admitting: Family Medicine

## 2017-02-08 DIAGNOSIS — Q678 Other congenital deformities of chest: Secondary | ICD-10-CM

## 2017-11-13 ENCOUNTER — Ambulatory Visit (HOSPITAL_COMMUNITY): Payer: Self-pay | Admitting: Psychiatry

## 2017-12-12 ENCOUNTER — Ambulatory Visit: Payer: BLUE CROSS/BLUE SHIELD | Admitting: Certified Nurse Midwife

## 2017-12-17 ENCOUNTER — Other Ambulatory Visit: Payer: Self-pay

## 2017-12-17 ENCOUNTER — Ambulatory Visit (INDEPENDENT_AMBULATORY_CARE_PROVIDER_SITE_OTHER): Payer: BLUE CROSS/BLUE SHIELD | Admitting: Certified Nurse Midwife

## 2017-12-17 ENCOUNTER — Other Ambulatory Visit (HOSPITAL_COMMUNITY)
Admission: RE | Admit: 2017-12-17 | Discharge: 2017-12-17 | Disposition: A | Discharge status: Home / Self Care | Payer: BLUE CROSS/BLUE SHIELD | Source: Ambulatory Visit | Attending: Obstetrics & Gynecology | Admitting: Obstetrics & Gynecology

## 2017-12-17 ENCOUNTER — Encounter: Payer: Self-pay | Admitting: Certified Nurse Midwife

## 2017-12-17 VITALS — BP 110/70 | HR 68 | Resp 16 | Ht 64.75 in | Wt 142.0 lb

## 2017-12-17 DIAGNOSIS — Z0000 Encounter for general adult medical examination without abnormal findings: Secondary | ICD-10-CM | POA: Diagnosis not present

## 2017-12-17 DIAGNOSIS — Z124 Encounter for screening for malignant neoplasm of cervix: Secondary | ICD-10-CM

## 2017-12-17 DIAGNOSIS — Z01419 Encounter for gynecological examination (general) (routine) without abnormal findings: Secondary | ICD-10-CM | POA: Diagnosis not present

## 2017-12-17 DIAGNOSIS — Z113 Encounter for screening for infections with a predominantly sexual mode of transmission: Secondary | ICD-10-CM

## 2017-12-17 NOTE — Progress Notes (Signed)
32 y.o. [id number] Single  African American Fe here for annual exam. Periods normal, no issues. Contraception none, her preference, not planning pregnancy, but would be welcome. Established with PCP and had aex and screening labs recently with some elevation of cholesterol. Working on diet at present. Request STD screening today. No concerns. No other health issues today.  Patient's last menstrual period was 12/10/2017 (exact date).          Sexually active: Yes.    The current method of family planning is none.    Exercising: No.  exercise Smoker:  no  Review of Systems  Constitutional: Negative.   HENT: Negative.   Eyes: Negative.   Respiratory: Negative.   Cardiovascular: Negative.   Gastrointestinal: Negative.   Genitourinary: Negative.   Musculoskeletal: Negative.   Skin: Negative.   Neurological: Negative.   Endo/Heme/Allergies: Negative.   Psychiatric/Behavioral: Negative.     Health Maintenance: Pap:  05-18-14 neg History of Abnormal Pap: yes MMG:  none Self Breast exams: occ Colonoscopy:  none BMD:   none TDaP:  2013 Shingles: no Pneumonia: no Hep C and HIV: HIV neg 2017 Labs: yes   reports that she has never smoked. She has never used smokeless tobacco. She reports that she drinks about 1.0 standard drinks of alcohol per week. She reports that she does not use drugs.  Past Medical History:  Diagnosis Date  . Abnormal Pap smear 2009   COLPO  LAST PAP 03/2011  . Anxiety   . Depo-Provera contraceptive status   . Depression   . Infection    YEAST X 1  . 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 Preterm delivery without spontaneous labor 01/09/2012   Induction for Preeclampsia    Past Surgical History:  Procedure Laterality Date  . COLPOSCOPY  2013  . NO PAST SURGERIES      No current outpatient medications on file.   No current facility-administered medications for this visit.     Family History  Problem Relation Age of Onset  . Drug abuse  Maternal Grandmother   . Anxiety disorder Neg Hx   . Bipolar disorder Neg Hx   . Depression Neg Hx   . Alcohol abuse Neg Hx   . Suicidality Neg Hx     ROS:  Pertinent items are noted in HPI.  Otherwise, a comprehensive ROS was negative.  Exam:   BP 110/70   Pulse 68   Resp 16   Ht 5' 4.75" (1.645 m)   Wt 142 lb (64.4 kg)   LMP 12/10/2017 (Exact Date)   BMI 23.81 kg/m  Height: 5' 4.75" (164.5 cm) Ht Readings from Last 3 Encounters:  12/17/17 5' 4.75" (1.645 m)  12/11/16 5' 4.5" (1.638 m)  12/08/15 5' 4.5" (1.638 m)    General appearance: alert, cooperative and appears stated age Head: Normocephalic, without obvious abnormality, atraumatic Neck: no adenopathy, supple, symmetrical, trachea midline and thyroid normal to inspection and palpation Lungs: clear to auscultation bilaterally Breasts: normal appearance, no masses or tenderness, No nipple retraction or dimpling, No nipple discharge or bleeding, No axillary or supraclavicular adenopathy Heart: regular rate and rhythm Abdomen: soft, non-tender; no masses,  no organomegaly Extremities: extremities normal, atraumatic, no cyanosis or edema Skin: Skin color, texture, turgor normal. No rashes or lesions Lymph nodes: Cervical, supraclavicular, and axillary nodes normal. No abnormal inguinal nodes palpated Neurologic: Grossly normal   Pelvic: External genitalia:  no lesions  Urethra:  normal appearing urethra with no masses, tenderness or lesions              Bartholin's and Skene's: normal                 Vagina: normal appearing vagina with normal color and discharge, no lesions              Cervix: no cervical motion tenderness, no lesions and normal appearance              Pap taken: Yes.   Bimanual Exam:  Uterus:  normal size, contour, position, consistency, mobility, non-tender and mid position              Adnexa: normal adnexa and no mass, fullness, tenderness               Rectovaginal: Confirms                Anus:  normal sphincter tone, no lesions  Chaperone present: yes  A:  Well Woman with normal exam  Contraception none, pregnancy would be welcome  STD screening  P:   Reviewed health and wellness pertinent to exam  Discussed starting on prenatal vitamins if she becomes pregnant and benefit of folic acid.  Lab: Affirm, GC/chlamydia, STD panel, Hep C  Pap smear: yes   counseled on breast self exam, STD prevention, HIV risk factors and prevention, feminine hygiene, adequate intake of calcium and vitamin D, diet and exercise  return annually or prn  An After Visit Summary was printed and given to the patient.

## 2017-12-17 NOTE — Patient Instructions (Signed)

## 2017-12-18 LAB — HEP, RPR, HIV PANEL
HIV Screen 4th Generation wRfx: NONREACTIVE
Hepatitis B Surface Ag: NEGATIVE
RPR: NONREACTIVE

## 2017-12-18 LAB — VAGINITIS/VAGINOSIS, DNA PROBE
CANDIDA SPECIES: NEGATIVE
GARDNERELLA VAGINALIS: POSITIVE — AB
Trichomonas vaginosis: NEGATIVE

## 2017-12-18 LAB — CYTOLOGY - PAP
Diagnosis: NEGATIVE
HPV: NOT DETECTED

## 2017-12-18 LAB — GC/CHLAMYDIA PROBE AMP
Chlamydia trachomatis, NAA: NEGATIVE
Neisseria gonorrhoeae by PCR: NEGATIVE

## 2017-12-18 LAB — HEPATITIS C ANTIBODY: Hep C Virus Ab: <0.1 s/co ratio (ref 0.0–0.9)

## 2017-12-19 ENCOUNTER — Telehealth: Payer: Self-pay

## 2017-12-19 MED ORDER — METRONIDAZOLE 500 MG PO TABS: 500.0000 mg | ORAL_TABLET | Freq: Two times a day (BID) | ORAL | 0 refills | Status: DC

## 2017-12-19 NOTE — Telephone Encounter (Signed)
Flagyl sent to pharmacy due to bv infection

## 2017-12-20 ENCOUNTER — Encounter

## 2018-02-08 ENCOUNTER — Encounter (HOSPITAL_COMMUNITY): Payer: Self-pay | Admitting: *Deleted

## 2018-02-08 ENCOUNTER — Other Ambulatory Visit: Payer: Self-pay

## 2018-02-08 DIAGNOSIS — R202 Paresthesia of skin: Secondary | ICD-10-CM | POA: Insufficient documentation

## 2018-02-08 NOTE — ED Triage Notes (Signed)
Pt states she was asleep and woke up and left hand was numb and felt like her breathing was not right.  Pt states left hand was numb and left pinky finger cramped and numbness spread across fingers and up arm.  Thinks went to bed after 9pm.  Pt has equal strength bilaterally upper extremities and no drifts

## 2018-02-09 ENCOUNTER — Emergency Department (HOSPITAL_COMMUNITY)
Admission: EM | Admit: 2018-02-09 | Discharge: 2018-02-09 | Disposition: A | Discharge status: Home / Self Care | Payer: BLUE CROSS/BLUE SHIELD | Attending: Emergency Medicine | Admitting: Emergency Medicine

## 2018-02-09 DIAGNOSIS — R202 Paresthesia of skin: Secondary | ICD-10-CM

## 2018-02-09 NOTE — ED Provider Notes (Signed)
[facility] EMERGENCY DEPARTMENT Provider Note   CSN: [id number] Arrival date & time: 02/08/18  2346     History   Chief Complaint Chief Complaint  Patient presents with  . Numbness    HPI Diane Craig is a 32 y.o. female.  Patient presents to the emergency department with a chief complaint of numbness and tingling in her left pinky and left ring finger.  She states that she noticed this when she awoke from sleep this evening.  She denies having had any alcohol prior to going to sleep.  States that initially felt like she slept on her arm wrong.  She states that the tingling has persisted.  Has not taken anything for her symptoms.  She also states that this feels like it is triggering a panic attack.  The history is provided by the patient. No language interpreter was used.    Past Medical History:  Diagnosis Date  . Abnormal Pap smear 2009   COLPO  LAST PAP 03/2011  . Anxiety   . Depo-Provera contraceptive status   . Depression   . Infection    YEAST X 1  . 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 Preterm delivery without spontaneous labor 01/09/2012   Induction for Preeclampsia    Patient Active Problem List   Diagnosis Date Noted  . Anxiety 12/11/2016  . 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 Dyspnea 03/22/2014  . NSVD (normal spontaneous vaginal delivery) 01/09/2012  . Preterm delivery without spontaneous labor 01/09/2012  . Elevated glucose tolerance test 01/08/2012  . Abnormal chromosomal and genetic finding on antenatal screening of mother-rec u/s q4wks at 30wks 08/08/2011    Past Surgical History:  Procedure Laterality Date  . COLPOSCOPY  2013  . NO PAST SURGERIES       OB History    Gravida  1   Para  1   Term  0   Preterm  1   AB  0   Living  1     SAB  0   TAB  0   Ectopic  0   Multiple  0   Live Births  1             Home Medications    Prior to Admission medications   Medication Sig Start Date End Date Taking? Authorizing Provider  metroNIDAZOLE (FLAGYL) 500 MG tablet Take 1 tablet (500 mg total) by mouth 2 (two) times daily. 12/19/17   Verner Chol, CNM    Family History Family History  Problem Relation Age of Onset  . Drug abuse Maternal Grandmother   . Anxiety disorder Neg Hx   . Bipolar disorder Neg Hx   . Depression Neg Hx   . Alcohol abuse Neg Hx   . Suicidality Neg Hx     Social History Social History   Tobacco Use  . Smoking status: Never Smoker  . Smokeless tobacco: Never Used  Substance Use Topics  . Alcohol use: Yes    Alcohol/week: 1.0 standard drinks    Types: 1 Glasses of wine per week  . Drug use: No     Allergies   Patient has no known allergies.   Review of Systems Review of Systems  All other systems reviewed and are negative.    Physical Exam Updated Vital Signs BP (!) 144/91 (BP Location: Right Arm)   Pulse (!) 102   Temp 98 F (36.7 C) (  Oral)   Resp 14   Ht 5\' 4"  (1.626 m)   Wt 65.3 kg   LMP 02/01/2018 (Approximate)   SpO2 100%   BMI 24.72 kg/m   Physical Exam  Constitutional: She is oriented to person, place, and time. She appears well-developed and well-nourished.  Anxious appearing  HENT:  Head: Normocephalic and atraumatic.  Eyes: Conjunctivae and EOM are normal.  Neck: Normal range of motion.  Cardiovascular: Normal rate and intact distal pulses.  Brisk cap refill  Pulmonary/Chest: Effort normal.  Abdominal: She exhibits no distension.  Musculoskeletal: Normal range of motion.  Left hand:  ROM and strength of left fingers 5/5, normal grip strength, normal pincer strength  Neurological: She is alert and oriented to person, place, and time.  Normal radial, ulnar, and median nerve testing  Skin: Skin is dry.  Psychiatric: She has a normal mood and affect. Her behavior is normal. Judgment and thought content normal.   Nursing note and vitals reviewed.    ED Treatments / Results  Labs (all labs ordered are listed, but only abnormal results are displayed) Labs Reviewed - No data to display  EKG None  Radiology No results found.  Procedures Procedures (including critical care time)  Medications Ordered in ED Medications - No data to display   Initial Impression / Assessment and Plan / ED Course  I have reviewed the triage vital signs and the nursing notes.  Pertinent labs & imaging results that were available during my care of the patient were reviewed by me and considered in my medical decision making (see chart for details).    Patient with numbness and tingling in left 5th finger after awakening from sleep.  No neck pain or back pain.  Neurovascularly intact.  Will give wrist cock-up splint and recommend hand follow-up if no improvement.  Quite anxious.  Patient has anxiety and has had panic attacks.  Patient reassured.    Final Clinical Impressions(s) / ED Diagnoses   Final diagnoses:  Paresthesia    ED Discharge Orders    None       Roxy HorsemanBrowning, Dayna Alia, PA-C 02/09/18 0017    Shaune PollackIsaacs, Cameron, MD 02/10/18 [phone]

## 2018-02-18 ENCOUNTER — Encounter: Payer: Self-pay | Admitting: Neurology

## 2018-02-18 ENCOUNTER — Ambulatory Visit (INDEPENDENT_AMBULATORY_CARE_PROVIDER_SITE_OTHER): Payer: Managed Care, Other (non HMO) | Admitting: Neurology

## 2018-02-18 VITALS — BP 105/68 | HR 81 | Ht 64.0 in | Wt 145.0 lb

## 2018-02-18 DIAGNOSIS — G5622 Lesion of ulnar nerve, left upper limb: Secondary | ICD-10-CM | POA: Diagnosis not present

## 2018-02-18 NOTE — Progress Notes (Signed)
Reason for visit: Left hand weakness and numbness  Referring physician: Dr. Jetty Peeks [name] is a 32 y.o. female  History of present illness:  Diane Craig is a 32 year old right-handed black female without significant past medical history who comes into the office today with onset of left hand numbness and weakness that began on 09 February 2018.  The patient woke up in the middle of the night with numbness of the left hand, the numbness was noted up to the shoulder area, she had some discomfort in the hand.  The numbness up to the shoulder has disappeared but the patient continues to have numbness in the ulnar aspect of the left hand and weakness of the left hand that has been persistent without change.  The patient has never had similar events in the past.  She reports no numbness or weakness on the face or the leg on the left side and no problems with the right side whatsoever.  She denies any neck pain or pain down the arms.  She denies any balance issues or difficulty controlling the bowels or the bladder.  The patient was seen by Dr. Merlyn Lot, she was felt to have a left ulnar neuropathy and she is sent to this office for an evaluation.  Past Medical History:  Diagnosis Date  . Abnormal Pap smear 2009   COLPO  LAST PAP 03/2011  . Anxiety   . Depo-Provera contraceptive status   . Depression   . Infection    YEAST X 1  . 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 Preterm delivery without spontaneous labor 01/09/2012   Induction for Preeclampsia    Past Surgical History:  Procedure Laterality Date  . COLPOSCOPY  2013  . NO PAST SURGERIES    . WISDOM TOOTH EXTRACTION      Family History  Problem Relation Age of Onset  . Drug abuse Maternal Grandmother   . Anxiety disorder Neg Hx   . Bipolar disorder Neg Hx   . Depression Neg Hx   . Alcohol abuse Neg Hx   . Suicidality Neg Hx     Social history:  reports that she has never smoked. She has never used smokeless  tobacco. She reports that she drinks about 1.0 standard drinks of alcohol per week. She reports that she does not use drugs.  Medications:  Prior to Admission medications   Medication Sig Start Date End Date Taking? Authorizing Provider  norelgestromin-ethinyl estradiol (ORTHO EVRA) 150-35 MCG/24HR transdermal patch Place 1 patch onto the skin once a week.   Yes [provider]     No Known Allergies  ROS:  Out of a complete 14 system review of symptoms, the patient complains only of the following symptoms, and all other reviewed systems are negative.  Numbness of the left hand  Blood pressure 105/68, pulse 81, height 5\' 4"  (1.626 m), weight 145 lb (65.8 kg), last menstrual period 02/01/2018.  Physical Exam  General: The patient is alert and cooperative at the time of the examination.  Eyes: Pupils are equal, round, and reactive to light. Discs are flat bilaterally.  Neck: The neck is supple, no carotid bruits are noted.  Respiratory: The respiratory examination is clear.  Cardiovascular: The cardiovascular examination reveals a regular rate and rhythm, no obvious murmurs or rubs are noted.  Skin: Extremities are without significant edema.  Neurologic Exam  Mental status: The patient is alert and oriented x 3 at the time of the examination. The patient  has apparent normal recent and remote memory, with an apparently normal attention span and concentration ability.  Cranial nerves: Facial symmetry is present. There is good sensation of the face to pinprick and soft touch bilaterally. The strength of the facial muscles and the muscles to head turning and shoulder shrug are normal bilaterally. Speech is well enunciated, no aphasia or dysarthria is noted. Extraocular movements are full. Visual fields are full. The tongue is midline, and the patient has symmetric elevation of the soft palate. No obvious hearing deficits are noted.  Motor: The motor testing reveals 5 over 5  strength of all 4 extremities, with exception of weakness with the intrinsic muscles of the left hand, and weakness with distal flexion of the fifth and fourth fingers of the left hand. Good symmetric motor tone is noted throughout.  Sensory: Sensory testing is intact to pinprick, soft touch, vibration sensation, and position sense on all 4 extremities, with exception of some decrease sensation on the ulnar aspect of the dorsum and thenar aspects of the left hand. No evidence of extinction is noted.  Coordination: Cerebellar testing reveals good finger-nose-finger and heel-to-shin bilaterally.  Gait and station: Gait is normal. Tandem gait is normal. Romberg is negative. No drift is seen.  Reflexes: Deep tendon reflexes are symmetric and normal bilaterally. Toes are downgoing bilaterally.   Assessment/Plan:  1.  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  The patient will be sent for blood work today, she will have nerve conduction studies of both arms and EMG study of the left arm.  She will otherwise follow-up in about 4 months.  Diane Craig. Keith  MD 02/18/2018 9:25 AM  [facility] [address] [place], [place] [id number]  Phone [phone] Fax [phone]

## 2018-02-20 LAB — HEMOGLOBIN A1C
Est. average glucose Bld gHb Est-mCnc: 114 mg/dL
Hgb A1c MFr Bld: 5.6 % (ref 4.8–5.6)

## 2018-02-20 LAB — LYME, WESTERN BLOT, SERUM (REFLEXED)
IGG P39 AB.: ABSENT
IGG P58 AB.: ABSENT
IgG P28 Ab.: ABSENT
IgG P30 Ab.: ABSENT
IgG P41 Ab.: ABSENT
IgG P45 Ab.: ABSENT
IgG P66 Ab.: ABSENT
IgM P23 Ab.: ABSENT
IgM P39 Ab.: ABSENT
IgM P41 Ab.: ABSENT
Lyme IgG Wb: NEGATIVE
Lyme IgM Wb: NEGATIVE

## 2018-02-20 LAB — ANA W/REFLEX: Anti Nuclear Antibody(ANA): NEGATIVE

## 2018-02-20 LAB — ANGIOTENSIN CONVERTING ENZYME: ANGIO CONVERT ENZYME: 30 U/L (ref 14–82)

## 2018-02-20 LAB — SEDIMENTATION RATE: Sed Rate: 10 mm/hr (ref 0–32)

## 2018-02-20 LAB — B. BURGDORFI ANTIBODIES: Lyme IgG/IgM Ab: 1.04 {ISR} — ABNORMAL HIGH (ref 0.00–0.90)

## 2018-03-18 ENCOUNTER — Ambulatory Visit: Payer: Managed Care, Other (non HMO) | Admitting: Neurology

## 2018-03-18 ENCOUNTER — Encounter: Payer: Self-pay | Admitting: Neurology

## 2018-03-18 ENCOUNTER — Ambulatory Visit (INDEPENDENT_AMBULATORY_CARE_PROVIDER_SITE_OTHER): Payer: Managed Care, Other (non HMO) | Admitting: Neurology

## 2018-03-18 DIAGNOSIS — G5622 Lesion of ulnar nerve, left upper limb: Secondary | ICD-10-CM | POA: Diagnosis not present

## 2018-03-18 HISTORY — DX: Lesion of ulnar nerve, left upper limb: G56.22

## 2018-03-18 NOTE — Procedures (Signed)
     HISTORY:  Diane Craig is a 33 year old patient with onset of left arm weakness and numbness that began around the beginning of December 2019.  The patient is being evaluated for possible ulnar neuropathy versus a brachial plexopathy.  The patient returns for an evaluation.  NERVE CONDUCTION STUDIES:  Nerve conduction studies were performed on both upper extremities.  The distal motor latencies for the median and ulnar nerves were within normal limits bilaterally.  The motor amplitudes for these nerves were normal bilaterally with exception that there were low motor amplitudes for the left ulnar nerve.  Nerve conduction velocities for the median and ulnar nerves were normal bilaterally with exception of slowing above and below the elbow for the left ulnar nerve.  The sensory latencies for the median and ulnar nerves were within normal limits bilaterally.  EMG STUDIES:  EMG study was performed on the left upper extremity:  The first dorsal interosseous muscle reveals 2 to 3 K units with decreased recruitment. 3+ fibrillations and positive waves were noted. The abductor pollicis brevis muscle reveals 2 to 4 K units with full recruitment. No fibrillations or positive waves were noted. The extensor indicis proprius muscle reveals 1 to 3 K units with full recruitment. No fibrillations or positive waves were noted. The pronator teres muscle reveals 2 to 3 K units with full recruitment. No fibrillations or positive waves were noted. The flexor digitorum profundus muscle (III-IV) reveals 2 to 3 K units with decreased recruitment.  2+ fibrillations and positive waves were noted. The biceps muscle reveals 1 to 2 K units with full recruitment. No fibrillations or positive waves were noted. The triceps muscle reveals 2 to 4 K units with full recruitment. No fibrillations or positive waves were noted. The anterior deltoid muscle reveals 2 to 3 K units with full recruitment. No fibrillations or  positive waves were noted. The cervical paraspinal muscles were tested at 2 levels. No abnormalities of insertional activity were seen at either level tested. There was poor relaxation.   IMPRESSION:  Nerve conduction studies done on both upper extremities shows evidence of a left ulnar neuropathy at the elbow.  EMG evaluation of the left upper extremity confirms the nerve conduction study findings, there is evidence of a left ulnar neuropathy at the elbow, no evidence of an overlying cervical radiculopathy.  Marlan Palau MD 03/18/2018 9:17 AM  [facility] [address] [place], [place] [address]  Phone [phone] Fax [phone]

## 2018-03-18 NOTE — Progress Notes (Signed)
Please refer to EMG and nerve conduction procedure note.  

## 2018-03-18 NOTE — Progress Notes (Addendum)
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  The patient indicates that the onset of the neuropathy was at the end of November or beginning of December 2019, we will follow the patient conservatively, she will follow-up here in mid April 2020, if she is demonstrating no benefit or improvement whatsoever, we may re-refer to Dr. Merlyn LotKuzma.     MNC    Nerve / Sites Muscle Latency Ref. Amplitude Ref. Rel Amp Segments Distance Velocity Ref. Area    ms ms mV mV %  cm m/s m/s mVms  R Median - APB     Wrist APB 2.8 ?4.4 12.0 ?4.0 100 Wrist - APB 7   35.2     Upper arm APB 6.7  11.9  98.9 Upper arm - Wrist 23 59 ?49 34.8  L Median - APB     Wrist APB 2.5 ?4.4 8.4 ?4.0 100 Wrist - APB 7   25.6     Upper arm APB 6.4  8.5  101 Upper arm - Wrist 23 60 ?49 25.1  R Ulnar - ADM     Wrist ADM 2.2 ?3.3 12.1 ?6.0 100 Wrist - ADM 7   40.3     B.Elbow ADM 5.6  11.6  95.7 B.Elbow - Wrist 20 59 ?49 40.0     A.Elbow ADM 7.2  11.2  96.8 A.Elbow - B.Elbow 10 64 ?49 38.3         A.Elbow - Wrist      L Ulnar - ADM     Wrist ADM 2.8 ?3.3 2.9 ?6.0 100 Wrist - ADM 7   7.0     B.Elbow ADM 8.4  2.4  81.9 B.Elbow - Wrist 20 36 ?49 6.3     A.Elbow ADM 10.5  2.3  95.6 A.Elbow - B.Elbow 10 48 ?49 5.5         A.Elbow - Wrist                 SNC    Nerve / Sites Rec. Site Peak Lat Ref.  Amp Ref. Segments Distance    ms ms V V  cm  R Median - Orthodromic (Dig II, Mid palm)     Dig II Wrist 3.2 ?3.4 36 ?10 Dig II - Wrist 13  L Median - Orthodromic (Dig II, Mid palm)     Dig II Wrist 2.7 ?3.4 29 ?10 Dig II - Wrist 13  R Ulnar - Orthodromic, (Dig V, Mid palm)     Dig V Wrist 2.7 ?3.1 24 ?5 Dig V - Wrist 11  L Ulnar - Orthodromic, (Dig V, Mid palm)     Dig V Wrist 2.6 ?3.1 2 ?5 Dig V - Wrist 4311              F  Wave    Nerve F Lat Ref.   ms ms  R Ulnar - ADM 26.8 ?32.0  L Ulnar - ADM 29.4 ?32.0

## 2018-06-23 ENCOUNTER — Ambulatory Visit: Payer: Managed Care, Other (non HMO) | Admitting: Neurology

## 2018-06-24 ENCOUNTER — Telehealth: Payer: Self-pay

## 2018-06-24 NOTE — Telephone Encounter (Signed)
I contacted the pt in regards to her 06/24/18 appt scheduled with Dr. Anne Hahn. Pt was advised, Due to current COVID 19 pandemic, our office is severely reducing in office visits for at least the next 2 weeks, in order to minimize the risk to our patients and healthcare providers.   Pt was offered a video visit at the same time and date and agreed.   Pt understands that although there may be some limitations with this type of visit, we will take all precautions to reduce any security or privacy concerns.  Pt understands that this will be treated like an in office visit and we will file with pt's insurance, and there may be a patient responsible charge related to this service.  Pt's email is [email or web address]. Pt understands that the [employer] webex software must be downloaded and operational on the device pt plans to use for the visit.  EMR updated.

## 2018-06-26 ENCOUNTER — Telehealth: Payer: Self-pay | Admitting: Neurology

## 2018-06-26 ENCOUNTER — Ambulatory Visit: Payer: Managed Care, Other (non HMO) | Admitting: Neurology

## 2018-06-26 ENCOUNTER — Encounter: Payer: Self-pay | Admitting: Neurology

## 2018-06-26 ENCOUNTER — Other Ambulatory Visit: Payer: Self-pay

## 2018-06-26 DIAGNOSIS — G5622 Lesion of ulnar nerve, left upper limb: Secondary | ICD-10-CM

## 2018-06-26 NOTE — Telephone Encounter (Signed)
This patient did not show for a virtual revisit, did not enter into the WebEx meeting, was not available by telephone.

## 2018-06-26 NOTE — Progress Notes (Signed)
This patient was not available for her WebEx meeting, could not be reached by phone, she did not show for a revisit.

## 2018-12-14 ENCOUNTER — Other Ambulatory Visit: Payer: Self-pay

## 2018-12-14 ENCOUNTER — Emergency Department (HOSPITAL_COMMUNITY)
Admission: EM | Admit: 2018-12-14 | Discharge: 2018-12-14 | Disposition: A | Discharge status: Home / Self Care | Payer: Medicaid Other | Attending: Emergency Medicine | Admitting: Emergency Medicine

## 2018-12-14 DIAGNOSIS — F419 Anxiety disorder, unspecified: Secondary | ICD-10-CM | POA: Insufficient documentation

## 2018-12-14 DIAGNOSIS — R1032 Left lower quadrant pain: Secondary | ICD-10-CM | POA: Diagnosis present

## 2018-12-14 DIAGNOSIS — K8080 Other cholelithiasis without obstruction: Secondary | ICD-10-CM | POA: Diagnosis not present

## 2018-12-14 DIAGNOSIS — R109 Unspecified abdominal pain: Secondary | ICD-10-CM

## 2018-12-14 LAB — URINALYSIS, ROUTINE W REFLEX MICROSCOPIC
Bacteria, UA: NONE SEEN
Bilirubin Urine: NEGATIVE
Glucose, UA: NEGATIVE mg/dL
Ketones, ur: NEGATIVE mg/dL
Nitrite: NEGATIVE
Protein, ur: NEGATIVE mg/dL
Specific Gravity, Urine: 1.003 — ABNORMAL LOW (ref 1.005–1.030)
pH: 6 (ref 5.0–8.0)

## 2018-12-14 LAB — COMPREHENSIVE METABOLIC PANEL
ALT: 31 U/L (ref 0–44)
AST: 21 U/L (ref 15–41)
Albumin: 4.3 g/dL (ref 3.5–5.0)
Alkaline Phosphatase: 43 U/L (ref 38–126)
Anion gap: 6 (ref 5–15)
BUN: 12 mg/dL (ref 6–20)
CO2: 25 mmol/L (ref 22–32)
Calcium: 9.3 mg/dL (ref 8.9–10.3)
Chloride: 105 mmol/L (ref 98–111)
Creatinine, Ser: 0.98 mg/dL (ref 0.44–1.00)
GFR calc Af Amer: >60 mL/min (ref >60)
GFR calc non Af Amer: >60 mL/min (ref >60)
Glucose, Bld: 84 mg/dL (ref 70–99)
Potassium: 3.8 mmol/L (ref 3.5–5.1)
Sodium: 136 mmol/L (ref 135–145)
Total Bilirubin: 0.3 mg/dL (ref 0.3–1.2)
Total Protein: 8 g/dL (ref 6.5–8.1)

## 2018-12-14 LAB — CBC
HCT: 39.5 % (ref 36.0–46.0)
Hemoglobin: 12 g/dL (ref 12.0–15.0)
MCH: 26.4 pg (ref 26.0–34.0)
MCHC: 30.4 g/dL (ref 30.0–36.0)
MCV: 86.8 fL (ref 80.0–100.0)
Platelets: 261 10*3/uL (ref 150–400)
RBC: 4.55 MIL/uL (ref 3.87–5.11)
RDW: 13.3 % (ref 11.5–15.5)
WBC: 3.2 10*3/uL — ABNORMAL LOW (ref 4.0–10.5)
nRBC: 0 % (ref 0.0–0.2)

## 2018-12-14 LAB — I-STAT BETA HCG BLOOD, ED (MC, WL, AP ONLY): I-stat hCG, quantitative: <5 m[IU]/mL (ref <5)

## 2018-12-14 LAB — LIPASE, BLOOD: Lipase: 42 U/L (ref 11–51)

## 2018-12-14 MED ORDER — ACETAMINOPHEN 500 MG PO TABS
1000.0000 mg | ORAL_TABLET | Freq: Once | ORAL | Status: AC
  Administered 2018-12-14: 22:00:00 1000 mg via ORAL
  Filled 2018-12-14: qty 2

## 2018-12-14 MED ORDER — SODIUM CHLORIDE 0.9% FLUSH
3.0000 mL | Freq: Once | INTRAVENOUS | Status: AC
  Administered 2018-12-14: 20:00:00 3 mL via INTRAVENOUS

## 2018-12-14 NOTE — ED Provider Notes (Signed)
[facility] DE[facility] Emergency Department Provider Note MRN:  [id number]  Arrival date & time: 12/14/18     Chief Complaint   Abdominal Pain   History of Present Illness   Diane Craig is a 33 y.o. year-old female with a history of anxiety presenting to the ED with chief complaint of abdominal pain.  Abdominal pain for the past 1 or 2 weeks.  Mostly left lower quadrant but migrates.  [facility], was told she has gallstones last week.  She was told to go to the emergency department if her symptoms worsen.  She feels they are worse.  Pain today in the left lower quadrant, mild in severity, not related to meals, no exacerbating relieving factors.  Denies fever, no chest pain or shortness of breath.  No dysuria, currently on menstrual cycle.  Review of Systems  A complete 10 system review of systems was obtained and all systems are negative except as noted in the HPI and PMH.   Patient's Health History    Past Medical History:  Diagnosis Date  . Abnormal Pap smear 2009   COLPO  LAST PAP 03/2011  . Anxiety   . Depo-Provera contraceptive status   . Depression   . 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 Infection    YEAST X 1  . 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 Preterm delivery without spontaneous labor 01/09/2012   Induction for Preeclampsia    Past Surgical History:  Procedure Laterality Date  . COLPOSCOPY  2013  . NO PAST SURGERIES    . WISDOM TOOTH EXTRACTION      Family History  Problem Relation Age of Onset  . Drug abuse Maternal Grandmother   . Hypercholesterolemia Mother   . Anxiety disorder Neg Hx   . Bipolar disorder Neg Hx   . Depression Neg Hx   . Alcohol abuse Neg Hx   . Suicidality Neg Hx     Social History   Socioeconomic History  . Marital status: Single    Spouse name: Not on file  . Number of children: 1  . Years of education: 7316  . Highest education level: Not on file  Occupational History   . Occupation: Financial traderCUSTOMER SERVICE REP  Social Needs  . Financial resource strain: Not on file  . Food insecurity    Worry: Not on file    Inability: Not on file  . Transportation needs    Medical: Not on file    Non-medical: Not on file  Tobacco Use  . Smoking status: Never Smoker  . Smokeless tobacco: Never Used  Substance and Sexual Activity  . Alcohol use: Yes    Alcohol/week: 1.0 standard drinks    Types: 1 Glasses of wine per week  . Drug use: No  . Sexual activity: Yes    Partners: Male    Birth control/protection: None  Lifestyle  . Physical activity    Days per week: Not on file    Minutes per session: Not on file  . Stress: Not on file  Relationships  . Social Musicianconnections    Talks on phone: Not on file    Gets together: Not on file    Attends religious service: Not on file    Active member of club or organization: Not on file    Attends meetings of clubs or organizations: Not on file    Relationship status: Not on file  . Intimate partner violence    Fear  of current or ex partner: Not on file    Emotionally abused: Not on file    Physically abused: Not on file    Forced sexual activity: Not on file  Other Topics Concern  . Not on file  Social History Narrative   Right handed   Lives at home with her child    Caffeine 1 cup daily      Physical Exam  Vital Signs and Nursing Notes reviewed Vitals:   12/14/18 1939  BP: (!) 125/93  Pulse: 81  Resp: 16  Temp: 98.9 F (37.2 C)  SpO2: 98%    CONSTITUTIONAL: Well-appearing, NAD NEURO:  Alert and oriented x 3, no focal deficits EYES:  eyes equal and reactive ENT/NECK:  no LAD, no JVD CARDIO: Regular rate, well-perfused, normal S1 and S2 PULM:  CTAB no wheezing or rhonchi GI/GU:  normal bowel sounds, non-distended, non-tender MSK/SPINE:  No gross deformities, no edema SKIN:  no rash, atraumatic PSYCH:  Appropriate speech and behavior  Diagnostic and Interventional Summary    Labs Reviewed  CBC -  Abnormal; Notable for the following components:      Result Value   WBC 3.2 (*)    All other components within normal limits  URINALYSIS, ROUTINE W REFLEX MICROSCOPIC - Abnormal; Notable for the following components:   Color, Urine STRAW (*)    Specific Gravity, Urine 1.003 (*)    Hgb urine dipstick MODERATE (*)    Leukocytes,Ua TRACE (*)    All other components within normal limits  LIPASE, BLOOD  COMPREHENSIVE METABOLIC PANEL  I-STAT BETA HCG BLOOD, ED ([facility], [facility], [facility] ONLY)    No orders to display    Medications  sodium chloride flush (NS) 0.9 % injection 3 mL (3 mLs Intravenous Given 12/14/18 2026)  acetaminophen (TYLENOL) tablet 1,000 mg (1,000 mg Oral Given 12/14/18 2131)     Procedures Critical Care  ED Course and Medical Decision Making  I have reviewed the triage vital signs and the nursing notes.  Pertinent labs & imaging results that were available during my care of the patient were reviewed by me and considered in my medical decision making (see below for details).  Patient has normal vital signs and a very reassuring, soft, nontender abdominal exam.  Her ultrasound results were not in our system but she has them on her phone.  She received a complete abdominal ultrasound on September 25.  Revealing cholelithiasis without cholecystitis.  Otherwise unremarkable.  I doubt that patient's left lower quadrant pain is related to her cholelithiasis.  She seems rather anxious about this diagnosis and I believe that this ED visit is largely for reassurance and education.  Will screen labs to ensure no signs of biliary obstruction.  Otherwise there is no indication for further imaging today.  With normal labs she will be appropriate for referral to general surgery and close follow-up with PCP.  Elmer Sow. Pilar Plate, MD [facility] Emergency Medicine [facility] [email or web address]  Final Clinical Impressions(s) / ED Diagnoses     ICD-10-CM   1. Abdominal pain, unspecified  abdominal location  R10.9   2. Biliary calculus of other site without obstruction  K80.80     ED Discharge Orders    None      Discharge Instructions Discussed with and Provided to Patient:   Discharge Instructions     You were evaluated in the Emergency Department and after careful evaluation, we did not find any emergent condition requiring admission or further testing  in the hospital.  Your exam/testing today is overall reassuring.  As discussed, please follow-up with the surgery office provided for future management of your gallstones.  Please return to the Emergency Department if you experience any worsening of your condition.  We encourage you to follow up with a primary care provider.  Thank you for allowing [place] to be a part of your care.       Maudie Flakes, MD 12/14/18 2138

## 2018-12-14 NOTE — Discharge Instructions (Signed)
You were evaluated in the Emergency Department and after careful evaluation, we did not find any emergent condition requiring admission or further testing in the hospital.  Your exam/testing today is overall reassuring.  As discussed, please follow-up with the surgery office provided for future management of your gallstones.  Please return to the Emergency Department if you experience any worsening of your condition.  We encourage you to follow up with a primary care provider.  Thank you for allowing [place] to be a part of your care.

## 2018-12-14 NOTE — ED Triage Notes (Signed)
Patient reports abdominal pain and discomfort. Patient reports she was seen for this by her PCP. Patient reports she had an ultrasound done and was told she has gallstones. Patient reports increased pain and discomfort in the past 24 hours.

## 2018-12-19 ENCOUNTER — Ambulatory Visit: Payer: BLUE CROSS/BLUE SHIELD | Admitting: Certified Nurse Midwife

## 2018-12-25 ENCOUNTER — Other Ambulatory Visit (HOSPITAL_COMMUNITY): Payer: Self-pay | Admitting: Geriatric Medicine

## 2018-12-25 ENCOUNTER — Other Ambulatory Visit: Payer: Self-pay | Admitting: Geriatric Medicine

## 2018-12-25 DIAGNOSIS — R1084 Generalized abdominal pain: Secondary | ICD-10-CM

## 2019-01-11 HISTORY — PX: CHOLECYSTECTOMY: SHX55

## 2019-01-19 ENCOUNTER — Encounter (HOSPITAL_COMMUNITY): Payer: Self-pay

## 2019-01-19 ENCOUNTER — Ambulatory Visit (HOSPITAL_COMMUNITY): Payer: Medicaid Other | Attending: Geriatric Medicine

## 2019-04-07 ENCOUNTER — Encounter (HOSPITAL_COMMUNITY): Payer: Self-pay | Admitting: Emergency Medicine

## 2019-04-07 ENCOUNTER — Other Ambulatory Visit: Payer: Self-pay

## 2019-04-07 ENCOUNTER — Emergency Department (HOSPITAL_COMMUNITY)
Admission: EM | Admit: 2019-04-07 | Discharge: 2019-04-07 | Disposition: A | Discharge status: Home / Self Care | Payer: Commercial Managed Care - PPO | Attending: Emergency Medicine | Admitting: Emergency Medicine

## 2019-04-07 DIAGNOSIS — Z79899 Other long term (current) drug therapy: Secondary | ICD-10-CM | POA: Insufficient documentation

## 2019-04-07 DIAGNOSIS — Z1152 Encounter for screening for COVID-19: Secondary | ICD-10-CM

## 2019-04-07 DIAGNOSIS — Z20822 Contact with and (suspected) exposure to covid-19: Secondary | ICD-10-CM | POA: Diagnosis not present

## 2019-04-07 DIAGNOSIS — J029 Acute pharyngitis, unspecified: Secondary | ICD-10-CM | POA: Diagnosis not present

## 2019-04-07 LAB — GROUP A STREP BY PCR: Group A Strep by PCR: NOT DETECTED

## 2019-04-07 NOTE — ED Provider Notes (Signed)
[facility] EMERGENCY DEPARTMENT Provider Note   CSN: [id number] Arrival date & time: 04/07/19  2376     History Chief Complaint  Patient presents with   Sore Throat    Diane Craig is a 34 y.o. female.  Patient presents to the ED with a chief complaint of sore throat.  She states that the symptoms started earlier today.  She states that it feels like her throat is swollen.  She states the symptoms started suddenly.  She denies any fever or cough.  Denies any COVID exposures, but she is concerned about this and would like to be tested.  She states that she had improvement of her symptoms after taking Nyquil.  The history is provided by the patient. No language interpreter was used.       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   Dyspnea 03/22/2014   NSVD (normal spontaneous vaginal delivery) 01/09/2012   Preterm delivery without spontaneous labor 01/09/2012   Elevated glucose tolerance test 01/08/2012   Abnormal chromosomal and genetic finding on antenatal screening of mother-rec u/s q4wks at 30wks 08/08/2011    Past Surgical History:  Procedure Laterality Date   COLPOSCOPY  2013   NO PAST SURGERIES     WISDOM TOOTH EXTRACTION       OB History    Gravida  1   Para  1   Term  0   Preterm  1   AB  0   Living  1     SAB  0   TAB  0   Ectopic  0   Multiple  0   Live Births  1           Family History  Problem Relation Age of Onset   Drug abuse Maternal Grandmother    Hypercholesterolemia Mother    Anxiety disorder Neg Hx    Bipolar disorder Neg Hx    Depression Neg Hx    Alcohol abuse Neg Hx    Suicidality Neg Hx     Social History   Tobacco Use   Smoking status: Never Smoker   Smokeless tobacco: Never Used  Substance Use Topics   Alcohol use: Yes    Alcohol/week: 1.0 standard drinks    Types: 1 Glasses of wine per week   Drug use: No    Home Medications Prior to Admission medications   Medication Sig Start Date End Date Taking? Authorizing Provider  calcium-vitamin D (OSCAL WITH D) 250-125 MG-UNIT tablet Take 1 tablet by mouth daily.    [provider]  norelgestromin-ethinyl estradiol Marilu Favre) 150-35 MCG/24HR transdermal patch Place 1 patch onto the skin once a week.    [provider]    Allergies    Patient has no known allergies.  Review of Systems   Review of Systems  All other systems reviewed and are  negative.   Physical Exam Updated Vital Signs BP 127/80 (BP Location: Left Arm)    Pulse 90    Temp 98.7 F (37.1 C) (Oral)    Resp 18    LMP 03/30/2019    SpO2 99%   Physical Exam Vitals and nursing note reviewed.  Constitutional:      General: She is not in acute distress.    Appearance: She is well-developed.  HENT:     Head: Normocephalic and atraumatic.     Mouth/Throat:     Comments: No swelling or abscess, very mild erythema, no exudates, normal phonation, no stridor No palpable cervical adenopathy Eyes:     Conjunctiva/sclera: Conjunctivae normal.  Cardiovascular:     Rate and Rhythm: Normal rate and regular rhythm.     Heart sounds: No murmur.  Pulmonary:     Effort: Pulmonary effort is normal. No respiratory distress.     Breath sounds: Normal breath sounds.  Abdominal:     Palpations: Abdomen is soft.     Tenderness: There is no abdominal  tenderness.  Musculoskeletal:     Cervical back: Neck supple.  Skin:    General: Skin is warm and dry.  Neurological:     Mental Status: She is alert.     ED Results / Procedures / Treatments   Labs (all labs ordered are listed, but only abnormal results are displayed) Labs Reviewed  GROUP A STREP BY PCR  NOVEL CORONAVIRUS, NAA (HOSP ORDER, SEND-OUT TO REF LAB; TAT 18-24 HRS)    EKG None  Radiology No results found.  Procedures Procedures (including critical care time)  Medications Ordered in ED Medications - No data to display  ED Course  I have reviewed the triage vital signs and the nursing notes.  Pertinent labs & imaging results that were available during my care of the patient were reviewed by me and considered in my medical decision making (see chart for details).    MDM Rules/Calculators/A&P                      Patient with sore throat.  Onset today.  Afebrile.  No concerning features on exam.  Will check strep and covid.  Strep negative.  Diane Craig was evaluated in Emergency Department on 04/07/2019 for the symptoms described in the history of present illness. She was evaluated in the context of the global COVID-19 pandemic, which necessitated consideration that the patient might be at risk for infection with the SARS-CoV-2 virus that causes COVID-19. Institutional protocols and algorithms that pertain to the evaluation of patients at risk for COVID-19 are in a state of rapid change based on information released by regulatory bodies including the CDC and federal and state organizations. These policies and algorithms were followed during the patient's care in the ED.    Final Clinical Impression(s) / ED Diagnoses Final diagnoses:  Pharyngitis, unspecified etiology  Encounter for screening for COVID-19    Rx / DC Orders ED Discharge Orders    None       Roxy Horseman, PA-C 04/07/19 0403    Mesner, Barbara Cower, MD 04/07/19 [phone]

## 2019-04-07 NOTE — ED Notes (Signed)
One touch patient. See provider's assessment

## 2019-04-07 NOTE — Discharge Instructions (Signed)
You should quarantine at home until your COVID-19 test results.  If it is negative, you can return to work.  If it is positive, you need to quarantine for 14 days.  Your strep test was negative.

## 2019-04-07 NOTE — ED Triage Notes (Signed)
Pt c/o sore throat and chills that started yesterday morning. No known covid contacts.

## 2019-04-08 LAB — NOVEL CORONAVIRUS, NAA (HOSP ORDER, SEND-OUT TO REF LAB; TAT 18-24 HRS): SARS-CoV-2, NAA: NOT DETECTED

## 2019-04-22 ENCOUNTER — Other Ambulatory Visit: Payer: Self-pay

## 2019-04-24 NOTE — Progress Notes (Signed)
34 y.o. G1P0101 Single  African American Fe here for annual exam. Periods normal, no missed period in last year. No partner change but desires STD screening. Working for home and would like more to be in office occasional. Had gallbladder issues and had removal in 01/2019, no issues now. Aware of slight weight gain only. Contraception Ortho Evra working well, denies any warning signs with use. No other  health issues today.  Patient's last menstrual period was 03/30/2019 (exact date).          Sexually active: Yes.    The current method of family planning is Ortho-Evra patches weekly.    Exercising: No.  exercise Smoker:  no  Review of Systems  Constitutional: Negative.   HENT: Negative.   Eyes: Negative.   Respiratory: Negative.   Cardiovascular: Negative.   Gastrointestinal: Negative.   Genitourinary: Negative.   Musculoskeletal: Negative.   Skin: Negative.   Neurological: Negative.   Endo/Heme/Allergies: Negative.   Psychiatric/Behavioral: Negative.     Health Maintenance: Pap:  05-18-14 neg, 12-17-17 neg HPV HR neg History of Abnormal Pap: yes MMG:  none Self Breast exams: no Colonoscopy:  none BMD:   none TDaP:  2013 Shingles: no Pneumonia: no Hep C and HIV: both neg 2019 Labs: if needed   reports that she has never smoked. She has never used smokeless tobacco. She reports current alcohol use of about 1.0 standard drinks of alcohol per week. She reports that she does not use drugs.  Past Medical History:  Diagnosis Date  . Abnormal Pap smear 2009   COLPO  LAST PAP 03/2011  . Anxiety   . Depo-Provera contraceptive status   . Depression   . 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 Infection    YEAST X 1  . 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 Preterm delivery without spontaneous labor 01/09/2012   Induction for Preeclampsia    Past Surgical History:  Procedure Laterality Date  . CHOLECYSTECTOMY  01/2019  . COLPOSCOPY  2013  . NO PAST  SURGERIES    . WISDOM TOOTH EXTRACTION      Current Outpatient Medications  Medication Sig Dispense Refill  . Cholecalciferol 25 MCG (1000 UT) tablet Take by mouth.    . Ferrous Sulfate (IRON PO) Take by mouth.    . Fluocinolone Acetonide Body 0.01 % OIL APPLY TO SCALP THREE TIMES WEEKLY. NOT FOR FACE.    Marland Kitchen ketoconazole (NIZORAL) 2 % shampoo Apply to scalp three times weekly; leave in 5 minutes before rinsing out    . norelgestromin-ethinyl estradiol Burr Medico) 150-35 MCG/24HR transdermal patch Place 1 patch onto the skin once a week.     No current facility-administered medications for this visit.    Family History  Problem Relation Age of Onset  . Drug abuse Maternal Grandmother   . Hypercholesterolemia Mother   . Anxiety disorder Neg Hx   . Bipolar disorder Neg Hx   . Depression Neg Hx   . Alcohol abuse Neg Hx   . Suicidality Neg Hx     ROS:  Pertinent items are noted in HPI.  Otherwise, a comprehensive ROS was negative.  Exam:   BP 120/80   Pulse 68   Temp 98.1 F (36.7 C) (Skin)   Resp 16   Ht 5' 4.75" (1.645 m)   Wt 151 lb (68.5 kg)   LMP 03/30/2019 (Exact Date)   BMI 25.32 kg/m  Height: 5' 4.75" (164.5 cm) Ht Readings from Last 3  Encounters:  04/27/19 5' 4.75" (1.645 m)  02/18/18 5\' 4"  (1.626 m)  02/08/18 5\' 4"  (1.626 m)    General appearance: alert, cooperative and appears stated age Head: Normocephalic, without obvious abnormality, atraumatic Neck: no adenopathy, supple, symmetrical, trachea midline and thyroid normal to inspection and palpation Lungs: clear to auscultation bilaterally Breasts: normal appearance, no masses or tenderness, No nipple retraction or dimpling, No nipple discharge or bleeding, No axillary or supraclavicular adenopathy Heart: regular rate and rhythm Abdomen: soft, non-tender; no masses,  no organomegaly Extremities: extremities normal, atraumatic, no cyanosis or edema Skin: Skin color, texture, turgor normal. No rashes or  lesions Lymph nodes: Cervical, supraclavicular, and axillary nodes normal. No abnormal inguinal nodes palpated Neurologic: Grossly normal   Pelvic: External genitalia:  no lesions              Urethra:  normal appearing urethra with no masses, tenderness or lesions              Bartholin's and Skene's: normal                 Vagina: normal appearing vagina with normal color and discharge, no lesions              Cervix: no cervical motion tenderness, no lesions and normal appearance              Pap taken: No. Bimanual Exam:  Uterus:  normal size, contour, position, consistency, mobility, non-tender and anteverted              Adnexa: normal adnexa and no mass, fullness, tenderness               Rectovaginal: Confirms               Anus:  normal sphincter tone, no lesions  Chaperone present: yes  A:  Well Woman with normal exam   Contraception Xulane patch desired  STD screening desired   P:   Reviewed health and wellness pertinent to exam  Risks/benefits warning signs reviewed, desires continuation of patch  Rx Xulane patch see order with instructions  Discussed prevention with condom use alsol  Lab: Affirm, GC/Chlamydia, HIV, RPR, Hep C  Pap smear: no   counseled on breast self exam, STD prevention, HIV risk factors and prevention, expectation with patch and warning signs,feminine hygiene, adequate intake of calcium and vitamin D, diet and exercise  return annually or prn  An After Visit Summary was printed and given to the patient.

## 2019-04-27 ENCOUNTER — Encounter: Payer: Self-pay | Admitting: Certified Nurse Midwife

## 2019-04-27 ENCOUNTER — Ambulatory Visit (INDEPENDENT_AMBULATORY_CARE_PROVIDER_SITE_OTHER): Payer: Medicaid Other | Admitting: Certified Nurse Midwife

## 2019-04-27 ENCOUNTER — Other Ambulatory Visit: Payer: Self-pay

## 2019-04-27 VITALS — BP 120/80 | HR 68 | Temp 98.1°F | Resp 16 | Ht 64.75 in | Wt 151.0 lb

## 2019-04-27 DIAGNOSIS — Z01419 Encounter for gynecological examination (general) (routine) without abnormal findings: Secondary | ICD-10-CM | POA: Diagnosis not present

## 2019-04-27 DIAGNOSIS — Z3045 Encounter for surveillance of transdermal patch hormonal contraceptive device: Secondary | ICD-10-CM | POA: Diagnosis not present

## 2019-04-27 DIAGNOSIS — Z0000 Encounter for general adult medical examination without abnormal findings: Secondary | ICD-10-CM

## 2019-04-27 DIAGNOSIS — Z113 Encounter for screening for infections with a predominantly sexual mode of transmission: Secondary | ICD-10-CM | POA: Diagnosis not present

## 2019-04-27 MED ORDER — XULANE 150-35 MCG/24HR TD PTWK: 1.0000 | MEDICATED_PATCH | TRANSDERMAL | 11 refills | Status: DC

## 2019-04-27 NOTE — Patient Instructions (Signed)
General topics  Next pap or exam is  due in 1 year Take a Women's multivitamin Take 1200 mg. of calcium daily - prefer dietary If any concerns in interim to call back  Breast Self-Awareness Practicing breast self-awareness may pick up problems early, prevent significant medical complications, and possibly save your life. By practicing breast self-awareness, you can become familiar with how your breasts look and feel and if your breasts are changing. This allows you to notice changes early. It can also offer you some reassurance that your breast health is good. One way to learn what is normal for your breasts and whether your breasts are changing is to do a breast self-exam. If you find a lump or something that was not present in the past, it is best to contact your caregiver right away. Other findings that should be evaluated by your caregiver include nipple discharge, especially if it is bloody; skin changes or reddening; areas where the skin seems to be pulled in (retracted); or new lumps and bumps. Breast pain is seldom associated with cancer (malignancy), but should also be evaluated by a caregiver. BREAST SELF-EXAM The best time to examine your breasts is 5 7 days after your menstrual period is over.  ExitCare Patient Information 2013 [employer].   Exercise to Stay Healthy Exercise helps you become and stay healthy. EXERCISE IDEAS AND TIPS Choose exercises that:  You enjoy.  Fit into your day. You do not need to exercise really hard to be healthy. You can do exercises at a slow or medium level and stay healthy. You can:  Stretch before and after working out.  Try yoga, Pilates, or tai chi.  Lift weights.  Walk fast, swim, jog, run, climb stairs, bicycle, dance, or rollerskate.  Take aerobic classes. Exercises that burn about 150 calories:  Running 1  miles in 15 minutes.  Playing volleyball for 45 to 60 minutes.  Washing and waxing a car for 45 to 60  minutes.  Playing touch football for 45 minutes.  Walking 1  miles in 35 minutes.  Pushing a stroller 1  miles in 30 minutes.  Playing basketball for 30 minutes.  Raking leaves for 30 minutes.  Bicycling 5 miles in 30 minutes.  Walking 2 miles in 30 minutes.  Dancing for 30 minutes.  Shoveling snow for 15 minutes.  Swimming laps for 20 minutes.  Walking up stairs for 15 minutes.  Bicycling 4 miles in 15 minutes.  Gardening for 30 to 45 minutes.  Jumping rope for 15 minutes.  Washing windows or floors for 45 to 60 minutes. Document Released: 03/31/2010 Document Revised: 05/21/2011 Document Reviewed: 03/31/2010 ExitCare Patient Information 2013 [employer].   Other topics ( that may be useful information):    Sexually Transmitted Disease Sexually transmitted disease (STD) refers to any infection that is passed from person to person during sexual activity. This may happen by way of saliva, semen, blood, vaginal mucus, or urine. Common STDs include:  Gonorrhea.  Chlamydia.  Syphilis.  HIV/AIDS.  Genital herpes.  Hepatitis B and C.  Trichomonas.  Human papillomavirus (HPV).  Pubic lice. CAUSES  An STD may be spread by bacteria, virus, or parasite. A person can get an STD by:  Sexual intercourse with an infected person.  Sharing sex toys with an infected person.  Sharing needles with an infected person.  Having intimate contact with the genitals, mouth, or rectal areas of an infected person. SYMPTOMS  Some people may not have any symptoms, but   they can still pass the infection to others. Different STDs have different symptoms. Symptoms include:  Painful or bloody urination.  Pain in the pelvis, abdomen, vagina, anus, throat, or eyes.  Skin rash, itching, irritation, growths, or sores (lesions). These usually occur in the genital or anal area.  Abnormal vaginal discharge.  Penile discharge in men.  Soft, flesh-colored skin growths in the  genital or anal area.  Fever.  Pain or bleeding during sexual intercourse.  Swollen glands in the groin area.  Yellow skin and eyes (jaundice). This is seen with hepatitis. DIAGNOSIS  To make a diagnosis, your caregiver may:  Take a medical history.  Perform a physical exam.  Take a specimen (culture) to be examined.  Examine a sample of discharge under a microscope.  Perform blood test TREATMENT   Chlamydia, gonorrhea, trichomonas, and syphilis can be cured with antibiotic medicine.  Genital herpes, hepatitis, and HIV can be treated, but not cured, with prescribed medicines. The medicines will lessen the symptoms.  Genital warts from HPV can be treated with medicine or by freezing, burning (electrocautery), or surgery. Warts may come back.  HPV is a virus and cannot be cured with medicine or surgery.However, abnormal areas may be followed very closely by your caregiver and may be removed from the cervix, vagina, or vulva through office procedures or surgery. If your diagnosis is confirmed, your recent sexual partners need treatment. This is true even if they are symptom-free or have a negative culture or evaluation. They should not have sex until their caregiver says it is okay. HOME CARE INSTRUCTIONS  All sexual partners should be informed, tested, and treated for all STDs.  Take your antibiotics as directed. Finish them even if you start to feel better.  Only take over-the-counter or prescription medicines for pain, discomfort, or fever as directed by your caregiver.  Rest.  Eat a balanced diet and drink enough fluids to keep your urine clear or pale yellow.  Do not have sex until treatment is completed and you have followed up with your caregiver. STDs should be checked after treatment.  Keep all follow-up appointments, Pap tests, and blood tests as directed by your caregiver.  Only use latex condoms and water-soluble lubricants during sexual activity. Do not use  petroleum jelly or oils.  Avoid alcohol and illegal drugs.  Get vaccinated for HPV and hepatitis. If you have not received these vaccines in the past, talk to your caregiver about whether one or both might be right for you.  Avoid risky sex practices that can break the skin. The only way to avoid getting an STD is to avoid all sexual activity.Latex condoms and dental dams (for oral sex) will help lessen the risk of getting an STD, but will not completely eliminate the risk. SEEK MEDICAL CARE IF:   You have a fever.  You have any new or worsening symptoms. Document Released: 05/19/2002 Document Revised: 05/21/2011 Document Reviewed: 05/26/2010 [facility] Patient Information 2013 [place].    Domestic Abuse You are being battered or abused if someone close to you hits, pushes, or physically hurts you in any way. You also are being abused if you are forced into activities. You are being sexually abused if you are forced to have sexual contact of any kind. You are being emotionally abused if you are made to feel worthless or if you are constantly threatened. It is important to remember that help is available. No one has the right to abuse you. PREVENTION OF FURTHER  ABUSE  Learn the warning signs of danger. This varies with situations but may include: the use of alcohol, threats, isolation from friends and family, or forced sexual contact. Leave if you feel that violence is going to occur.  If you are attacked or beaten, report it to the police so the abuse is documented. You do not have to press charges. The police can protect you while you or the attackers are leaving. Get the officer's name and badge number and a copy of the report.  Find someone you can trust and tell them what is happening to you: your caregiver, a nurse, clergy member, close friend or family member. Feeling ashamed is natural, but remember that you have done nothing wrong. No one deserves abuse. Document Released:  02/24/2000 Document Revised: 05/21/2011 Document Reviewed: 05/04/2010 ExitCare Patient Information 2013 [employer].    How Much is Too Much Alcohol? Drinking too much alcohol can cause injury, accidents, and health problems. These types of problems can include:   Car crashes.  Falls.  Family fighting (domestic violence).  Drowning.  Fights.  Injuries.  Burns.  Damage to certain organs.  Having a baby with birth defects. ONE DRINK CAN BE TOO MUCH WHEN YOU ARE:  Working.  Pregnant or breastfeeding.  Taking medicines. Ask your doctor.  Driving or planning to drive. If you or someone you know has a drinking problem, get help from a doctor.  Document Released: 12/23/2008 Document Revised: 05/21/2011 Document Reviewed: 12/23/2008 ExitCare Patient Information 2013 [employer]Care[employer].   Smoking Hazards Smoking cigarettes is extremely bad for your health. Tobacco smoke has over 200 known poisons in it. There are over 60 chemicals in tobacco smoke that cause cancer. Some of the chemicals found in cigarette smoke include:   Cyanide.  Benzene.  Formaldehyde.  Methanol (wood alcohol).  Acetylene (fuel used in welding torches).  Ammonia. Cigarette smoke also contains the poisonous gases nitrogen oxide and carbon monoxide.  Cigarette smokers have an increased risk of many serious medical problems and Smoking causes approximately:  90% of all lung cancer deaths in men.  80% of all lung cancer deaths in women.  90% of deaths from chronic obstructive lung disease. Compared with nonsmokers, smoking increases the risk of:  Coronary heart disease by 2 to 4 times.  Stroke by 2 to 4 times.  Men developing lung cancer by 23 times.  Women developing lung cancer by 13 times.  Dying from chronic obstructive lung diseases by 12 times.  . Smoking is the most preventable cause of death and disease in our society.  WHY IS SMOKING ADDICTIVE?  Nicotine is the chemical  agent in tobacco that is capable of causing addiction or dependence.  When you smoke and inhale, nicotine is absorbed rapidly into the bloodstream through your lungs. Nicotine absorbed through the lungs is capable of creating a powerful addiction. Both inhaled and non-inhaled nicotine may be addictive.  Addiction studies of cigarettes and spit tobacco show that addiction to nicotine occurs mainly during the teen years, when young people begin using tobacco products. WHAT ARE THE BENEFITS OF QUITTING?  There are many health benefits to quitting smoking.   Likelihood of developing cancer and heart disease decreases. Health improvements are seen almost immediately.  Blood pressure, pulse rate, and breathing patterns start returning to normal soon after quitting. QUITTING SMOKING   American Lung Association - 1-800-LUNGUSA  American Cancer Society - 1-800-ACS-2345 Document Released: 04/05/2004 Document Revised: 05/21/2011 Document Reviewed: 12/08/2008 ExitCare Patient Information 2013 ExitCare,   LLC.   Stress Management Stress is a state of physical or mental tension that often results from changes in your life or normal routine. Some common causes of stress are:  Death of a loved one.  Injuries or severe illnesses.  Getting fired or changing jobs.  Moving into a new home. Other causes may be:  Sexual problems.  Business or financial losses.  Taking on a large debt.  Regular conflict with someone at home or at work.  Constant tiredness from lack of sleep. It is not just bad things that are stressful. It may be stressful to:  Win the lottery.  Get married.  Buy a new car. The amount of stress that can be easily tolerated varies from person to person. Changes generally cause stress, regardless of the types of change. Too much stress can affect your health. It may lead to physical or emotional problems. Too little stress (boredom) may also become stressful. SUGGESTIONS TO  REDUCE STRESS:  Talk things over with your family and friends. It often is helpful to share your concerns and worries. If you feel your problem is serious, you may want to get help from a professional counselor.  Consider your problems one at a time instead of lumping them all together. Trying to take care of everything at once may seem impossible. List all the things you need to do and then start with the most important one. Set a goal to accomplish 2 or 3 things each day. If you expect to do too many in a single day you will naturally fail, causing you to feel even more stressed.  Do not use alcohol or drugs to relieve stress. Although you may feel better for a short time, they do not remove the problems that caused the stress. They can also be habit forming.  Exercise regularly - at least 3 times per week. Physical exercise can help to relieve that "uptight" feeling and will relax you.  The shortest distance between despair and hope is often a good night's sleep.  Go to bed and get up on time allowing yourself time for appointments without being rushed.  Take a short "time-out" period from any stressful situation that occurs during the day. Close your eyes and take some deep breaths. Starting with the muscles in your face, tense them, hold it for a few seconds, then relax. Repeat this with the muscles in your neck, shoulders, hand, stomach, back and legs.  Take good care of yourself. Eat a balanced diet and get plenty of rest.  Schedule time for having fun. Take a break from your daily routine to relax. HOME CARE INSTRUCTIONS   Call if you feel overwhelmed by your problems and feel you can no longer manage them on your own.  Return immediately if you feel like hurting yourself or someone else. Document Released: 08/22/2000 Document Revised: 05/21/2011 Document Reviewed: 04/14/2007 [facility] Patient Information 2013 [place].  Ethinyl Estradiol; Norelgestromin skin patches What is  this medicine? ETHINYL ESTRADIOL;NORELGESTROMIN (ETH in il es tra DYE ole; nor el JES troe min) skin patch is used as a contraceptive (birth control method). This medicine combines two types of female hormones, an estrogen and a progestin. This patch is used to prevent ovulation and pregnancy. This medicine may be used for other purposes; ask your health care provider or pharmacist if you have questions. COMMON BRAND NAME(S): Ortho Becky Sax What should I tell my health care provider before I take this medicine? They need to know  if you have or ever had any of these conditions:  abnormal vaginal bleeding  blood vessel disease or blood clots  breast, cervical, endometrial, ovarian, liver, or uterine cancer  diabetes  gallbladder disease  having surgery  heart disease or recent heart attack  high blood pressure  high cholesterol or triglycerides  history of irregular heartbeat or heart valve problems  kidney disease  liver disease  migraine headaches  protein C deficiency  protein S deficiency  recently had a baby, miscarriage, or abortion  stroke  systemic lupus erythematosus (SLE)  tobacco smoker  an unusual or allergic reaction to estrogens, progestins, other medicines, foods, dyes, or preservatives  pregnant or trying to get pregnant  breast-feeding How should I use this medicine? This patch is applied to the skin. Follow the directions on the prescription label. Apply to clean, dry, healthy skin on the buttock, abdomen, upper outer arm or upper torso, in a place where it will not be rubbed by tight clothing. Do not use lotions or other cosmetics on the site where the patch will go. Press the patch firmly in place for 10 seconds to ensure good contact with the skin. Change the patch every 7 days on the same day of the week for 3 weeks. You will then have a break from the patch for 1 week, after which you will apply a new patch. Do not use your medicine more  often than directed. Contact your pediatrician regarding the use of this medicine in children. Special care may be needed. This medicine has been used in female children who have started having menstrual periods. A patient package insert for the product will be given with each prescription and refill. Read this sheet carefully each time. The sheet may change frequently. Overdosage: If you think you have taken too much of this medicine contact a poison control center or emergency room at once. NOTE: This medicine is only for you. Do not share this medicine with others. What if I miss a dose? You will need to replace your patch once a week as directed. If your patch is lost or falls off, contact your health care professional for advice. You may need to use another form of birth control if your patch has been off for more than 1 day. What may interact with this medicine? Do not take this medicine with the following medications:  dasabuvir; ombitasvir; paritaprevir; ritonavir  ombitasvir; paritaprevir; ritonavir This medicine may also interact with the following medications:  acetaminophen  antibiotics or medicines for infections, especially rifampin, rifabutin, rifapentine, and possibly penicillins or tetracyclines  aprepitant or fosaprepitant  armodafinil  ascorbic acid (vitamin C)  barbiturate medicines, such as phenobarbital or primidone  bosentan  certain antiviral medicines for hepatitis, HIV or AIDS  certain medicines for cancer treatment  certain medicines for seizures like carbamazepine, clobazam, felbamate, lamotrigine, oxcarbazepine, phenytoin, rufinamide, topiramate  certain medicines for treating high cholesterol  cyclosporine  dantrolene  elagolix  flibanserin  grapefruit juice  lesinurad  medicines for diabetes  medicines to treat fungal infections, such as griseofulvin, miconazole, fluconazole, ketoconazole, itraconazole, posaconazole or  voriconazole  mifepristone  mitotane  modafinil  morphine  mycophenolate  St. John's wort  tamoxifen  temazepam  theophylline or aminophylline  thyroid hormones  tizanidine  tranexamic acid  ulipristal  warfarin This list may not describe all possible interactions. Give your health care provider a list of all the medicines, herbs, non-prescription drugs, or dietary supplements you use. Also tell them if you  smoke, drink alcohol, or use illegal drugs. Some items may interact with your medicine. What should I watch for while using this medicine? Visit your doctor or health care professional for regular checks on your progress. You will need a regular breast and pelvic exam and Pap smear while on this medicine. Use an additional method of contraception during the first cycle that you use this patch. If you have any reason to think you are pregnant, stop using this medicine right away and contact your doctor or health care professional. If you are using this medicine for hormone related problems, it may take several cycles of use to see improvement in your condition. Smoking increases the risk of getting a blood clot or having a stroke while you are using hormonal birth control, especially if you are more than 32 years old. You are strongly advised not to smoke. This medicine can make your body retain fluid, making your fingers, hands, or ankles swell. Your blood pressure can go up. Contact your doctor or health care professional if you feel you are retaining fluid. This medicine can make you more sensitive to the sun. Keep out of the sun. If you cannot avoid being in the sun, wear protective clothing and use sunscreen. Do not use sun lamps or tanning beds/booths. If you wear contact lenses and notice visual changes, or if the lenses begin to feel uncomfortable, consult your eye care specialist. In some women, tenderness, swelling, or minor bleeding of the gums may occur. Notify  your dentist if this happens. Brushing and flossing your teeth regularly may help limit this. See your dentist regularly and inform your dentist of the medicines you are taking. If you are going to have elective surgery or a MRI, you may need to stop using this medicine before the surgery or MRI. Consult your health care professional for advice. This medicine does not protect you against HIV infection (AIDS) or any other sexually transmitted diseases. What side effects may I notice from receiving this medicine? Side effects that you should report to your doctor or health care professional as soon as possible:  allergic reactions such as skin rash or itching, hives, swelling of the lips, mouth, tongue, or throat  breast tissue changes or discharge  dark patches of skin on your forehead, cheeks, upper lip, and chin  depression  high blood pressure  migraines or severe, sudden headaches  missed menstrual periods  signs and symptoms of a blood clot such as breathing problems; changes in vision; chest pain; severe, sudden headache; pain, swelling, warmth in the leg; trouble speaking; sudden numbness or weakness of the face, arm or leg  skin reactions at the patch site such as blistering, bleeding, itching, rash, or swelling  stomach pain  yellowing of the eyes or skin Side effects that usually do not require medical attention (report these to your doctor or health care professional if they continue or are bothersome):  breast tenderness  irregular vaginal bleeding or spotting, particularly during the first 3 months of use  headache  nausea  painful menstrual periods  skin redness or mild irritation at site where applied  weight gain (slight) This list may not describe all possible side effects. Call your doctor for medical advice about side effects. You may report side effects to FDA at 1-800-FDA-1088. Where should I keep my medicine? Keep out of the reach of children. Store  at room temperature between 15 and 30 degrees C (59 and 86 degrees F). Keep the patch  in its pouch until time of use. Throw away any unused medicine after the expiration date. Dispose of used patches properly. Since a used patch may still contain active hormones, fold the patch in half so that it sticks to itself prior to disposal. Throw away in a place where children or pets cannot reach. NOTE: This sheet is a summary. It may not cover all possible information. If you have questions about this medicine, talk to your doctor, pharmacist, or health care provider.  2020 Elsevier/Gold Standard (2018-06-03 11:56:29)

## 2019-04-28 ENCOUNTER — Other Ambulatory Visit: Payer: Self-pay

## 2019-04-28 LAB — HEPATITIS C ANTIBODY: Hep C Virus Ab: <0.1 s/co ratio (ref 0.0–0.9)

## 2019-04-28 LAB — SYPHILIS: RPR W/REFLEX TO RPR TITER AND TREPONEMAL ANTIBODIES, TRADITIONAL SCREENING AND DIAGNOSIS ALGORITHM: RPR Ser Ql: NONREACTIVE

## 2019-04-28 LAB — VAGINITIS/VAGINOSIS, DNA PROBE
Candida Species: NEGATIVE
Gardnerella vaginalis: POSITIVE — AB
Trichomonas vaginosis: NEGATIVE

## 2019-04-28 LAB — HIV ANTIBODY (ROUTINE TESTING W REFLEX): HIV Screen 4th Generation wRfx: NONREACTIVE

## 2019-04-28 MED ORDER — METRONIDAZOLE 500 MG PO TABS: 500.0000 mg | ORAL_TABLET | Freq: Two times a day (BID) | ORAL | 0 refills | Status: DC

## 2019-04-30 LAB — GC/CHLAMYDIA PROBE AMP
Chlamydia trachomatis, NAA: NEGATIVE
Neisseria Gonorrhoeae by PCR: POSITIVE — AB

## 2019-04-30 NOTE — Progress Notes (Signed)
Notify patient her Chlamydia was negative , Gonorrhea is positive. She will need to come in the office for treatment with Rocephin 500 mg. IM  Partner will need treatment.

## 2019-05-01 ENCOUNTER — Telehealth: Payer: Self-pay | Admitting: *Deleted

## 2019-05-01 NOTE — Telephone Encounter (Signed)
-----   Message from Verner Chol, CNM sent at 04/30/2019  2:46 PM EST ----- Notify patient her Chlamydia was negative , Gonorrhea is positive. She will need to come in the office for treatment with Rocephin 500 mg. IM  Partner will need treatment.

## 2019-05-01 NOTE — Telephone Encounter (Signed)
Leda Min, RN  05/01/2019 10:31 AM EST    Call placed to patient, no answer, mailbox full. No alternative number.   MyChart message to patient requesting return call.   See telephone encounter dated 05/01/19.

## 2019-05-04 ENCOUNTER — Other Ambulatory Visit: Payer: Self-pay

## 2019-05-04 ENCOUNTER — Ambulatory Visit (INDEPENDENT_AMBULATORY_CARE_PROVIDER_SITE_OTHER): Payer: Medicaid Other | Admitting: Certified Nurse Midwife

## 2019-05-04 VITALS — BP 125/70 | HR 75 | Temp 97.9°F | Ht 64.0 in | Wt 151.0 lb

## 2019-05-04 DIAGNOSIS — A549 Gonococcal infection, unspecified: Secondary | ICD-10-CM | POA: Diagnosis not present

## 2019-05-04 MED ORDER — CLINDAMYCIN PHOSPHATE 2 % VA CREA: 1.0000 | TOPICAL_CREAM | Freq: Every day | VAGINAL | 0 refills | Status: AC

## 2019-05-04 MED ORDER — CEFTRIAXONE SODIUM 500 MG IJ SOLR
500.0000 mg | Freq: Once | INTRAMUSCULAR | Status: AC
  Administered 2019-05-04: 10:00:00 500 mg via INTRAMUSCULAR

## 2019-05-04 NOTE — Telephone Encounter (Signed)
Spoke with patient, advised as seen below per Leota Sauers, CNM.   OV scheduled for 08/03/19 at 10am.   Routing to provider for final review. Patient is agreeable to disposition. Will close encounter.

## 2019-05-04 NOTE — Progress Notes (Signed)
Pt here for treatment of Gonorrhea. Pt given 500mg  Rocephin IM x 1. Pt tolerated well.

## 2019-05-04 NOTE — Telephone Encounter (Signed)
Reviewed with Leota Sauers, CNM. Rx for cleocin vaginal cream, place on applicator full vaginally at HS x5 nights to pharmacy.   Needs 3 mo OV for TOC.

## 2019-05-04 NOTE — Telephone Encounter (Signed)
Spoke with patient.   1. Advised of all results as seen below per Leota Sauers, CNM. Nurse visit scheduled for today at 9:45am. Covid 19 prescreen negative, precautions reviewed.   2. Patient was tx for BV with flagyl on 04/28/19. Patient reports difficulty breathing and swelling of her throat after taking for 2 days. Patient stopped medication and symptoms have since resolved. Advised patient to stop medication, will review alternative with provider. Allergies updated in Epic. Patient verbalizes understanding and is agreeable.   Confidential communicable disease report completed and faxed to [facility].   Routing to Leota Sauers, CNM to advise on alternative Rx for BV. Please advise on TOC for gonorrhea.

## 2019-05-29 ENCOUNTER — Encounter: Payer: Self-pay | Admitting: Certified Nurse Midwife

## 2019-05-29 ENCOUNTER — Encounter (HOSPITAL_COMMUNITY): Payer: Self-pay | Admitting: *Deleted

## 2019-05-29 ENCOUNTER — Emergency Department (HOSPITAL_COMMUNITY)
Admission: EM | Admit: 2019-05-29 | Discharge: 2019-05-30 | Disposition: A | Discharge status: Home / Self Care | Payer: Medicaid Other | Attending: Emergency Medicine | Admitting: Emergency Medicine

## 2019-05-29 ENCOUNTER — Other Ambulatory Visit: Payer: Self-pay

## 2019-05-29 DIAGNOSIS — K219 Gastro-esophageal reflux disease without esophagitis: Secondary | ICD-10-CM | POA: Insufficient documentation

## 2019-05-29 DIAGNOSIS — K5900 Constipation, unspecified: Secondary | ICD-10-CM | POA: Diagnosis not present

## 2019-05-29 DIAGNOSIS — R1012 Left upper quadrant pain: Secondary | ICD-10-CM | POA: Diagnosis present

## 2019-05-29 DIAGNOSIS — Z793 Long term (current) use of hormonal contraceptives: Secondary | ICD-10-CM | POA: Diagnosis not present

## 2019-05-29 LAB — CBC
HCT: 39.6 % (ref 36.0–46.0)
Hemoglobin: 12.4 g/dL (ref 12.0–15.0)
MCH: 26.3 pg (ref 26.0–34.0)
MCHC: 31.3 g/dL (ref 30.0–36.0)
MCV: 83.9 fL (ref 80.0–100.0)
Platelets: 251 10*3/uL (ref 150–400)
RBC: 4.72 MIL/uL (ref 3.87–5.11)
RDW: 12.6 % (ref 11.5–15.5)
WBC: 4.4 10*3/uL (ref 4.0–10.5)
nRBC: 0 % (ref 0.0–0.2)

## 2019-05-29 LAB — URINALYSIS, ROUTINE W REFLEX MICROSCOPIC
Bilirubin Urine: NEGATIVE
Glucose, UA: NEGATIVE mg/dL
Ketones, ur: NEGATIVE mg/dL
Leukocytes,Ua: NEGATIVE
Nitrite: NEGATIVE
Protein, ur: NEGATIVE mg/dL
Specific Gravity, Urine: 1.003 — ABNORMAL LOW (ref 1.005–1.030)
pH: 6 (ref 5.0–8.0)

## 2019-05-29 MED ORDER — SODIUM CHLORIDE 0.9% FLUSH: 3.0000 mL | Freq: Once | INTRAVENOUS | Status: DC

## 2019-05-30 ENCOUNTER — Emergency Department (HOSPITAL_COMMUNITY): Payer: Medicaid Other

## 2019-05-30 ENCOUNTER — Encounter (HOSPITAL_COMMUNITY): Payer: Self-pay | Admitting: Radiology

## 2019-05-30 LAB — COMPREHENSIVE METABOLIC PANEL
ALT: 29 U/L (ref 0–44)
AST: 19 U/L (ref 15–41)
Albumin: 3.8 g/dL (ref 3.5–5.0)
Alkaline Phosphatase: 41 U/L (ref 38–126)
Anion gap: 7 (ref 5–15)
BUN: 8 mg/dL (ref 6–20)
CO2: 24 mmol/L (ref 22–32)
Calcium: 9.1 mg/dL (ref 8.9–10.3)
Chloride: 106 mmol/L (ref 98–111)
Creatinine, Ser: 1.06 mg/dL — ABNORMAL HIGH (ref 0.44–1.00)
GFR calc Af Amer: >60 mL/min (ref >60)
GFR calc non Af Amer: >60 mL/min (ref >60)
Glucose, Bld: 99 mg/dL (ref 70–99)
Potassium: 4.3 mmol/L (ref 3.5–5.1)
Sodium: 137 mmol/L (ref 135–145)
Total Bilirubin: 0.4 mg/dL (ref 0.3–1.2)
Total Protein: 7.4 g/dL (ref 6.5–8.1)

## 2019-05-30 LAB — LIPASE, BLOOD: Lipase: 31 U/L (ref 11–51)

## 2019-05-30 LAB — I-STAT BETA HCG BLOOD, ED (MC, WL, AP ONLY): I-stat hCG, quantitative: <5 m[IU]/mL (ref <5)

## 2019-05-30 MED ORDER — IOHEXOL 300 MG/ML  SOLN
100.0000 mL | Freq: Once | INTRAMUSCULAR | Status: AC | PRN
  Administered 2019-05-30: 100 mL via INTRAVENOUS

## 2019-05-30 MED ORDER — POLYETHYLENE GLYCOL 3350 17 G PO PACK: 17.0000 g | PACK | Freq: Every day | ORAL | 0 refills | Status: DC

## 2019-05-30 MED ORDER — ALUM & MAG HYDROXIDE-SIMETH 200-200-20 MG/5ML PO SUSP
30.0000 mL | Freq: Once | ORAL | Status: AC
  Administered 2019-05-30: 30 mL via ORAL
  Filled 2019-05-30: qty 30

## 2019-05-30 MED ORDER — LIDOCAINE VISCOUS HCL 2 % MT SOLN
15.0000 mL | Freq: Once | OROMUCOSAL | Status: AC
  Administered 2019-05-30: 15 mL via ORAL
  Filled 2019-05-30: qty 15

## 2019-05-30 MED ORDER — OMEPRAZOLE 20 MG PO CPDR: 20.0000 mg | DELAYED_RELEASE_CAPSULE | Freq: Every day | ORAL | 0 refills | Status: DC

## 2019-05-30 NOTE — ED Provider Notes (Signed)
[facility] EMERGENCY DEPARTMENT Provider Note   CSN: [id number] Arrival date & time: 05/29/19  2231     History Chief Complaint  Patient presents with  . Abdominal Pain    Diane Craig is a 34 y.o. female.  The history is provided by the patient.  Abdominal Pain Pain location:  LUQ Pain quality: cramping   Pain radiates to:  Does not radiate Pain severity:  Moderate Onset quality:  Gradual Duration:  3 days Timing:  Constant Progression:  Waxing and waning Chronicity:  New Context: not alcohol use, not awakening from sleep, not eating and not laxative use   Relieved by:  Nothing Worsened by:  Nothing Ineffective treatments:  None tried Associated symptoms: no anorexia, no belching, no chest pain, no chills, no dysuria, no fatigue, no fever, no flatus, no hematemesis, no melena, no nausea, no shortness of breath and no sore throat   Risk factors: no alcohol abuse   Having normal BMs.  Has been eating a lot of greasy food.  No f/c/r.      Past Medical History:  Diagnosis Date  . Abnormal Pap smear 2009   COLPO  LAST PAP 03/2011  . Anxiety   . Depo-Provera contraceptive status   . Depression   . 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 Infection    YEAST X 1  . 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 Preterm delivery without spontaneous labor 01/09/2012   Induction for Preeclampsia    Patient Active Problem List   Diagnosis Date Noted  . 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 Anxiety 12/11/2016  . 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 Dyspnea 03/22/2014  . NSVD (normal spontaneous vaginal delivery) 01/09/2012  . Preterm delivery without spontaneous labor 01/09/2012  . Elevated glucose tolerance test 01/08/2012  . Abnormal chromosomal and genetic finding on antenatal screening of mother-rec u/s q4wks at 30wks 08/08/2011     Past Surgical History:  Procedure Laterality Date  . CHOLECYSTECTOMY  01/2019  . COLPOSCOPY  2013  . NO PAST SURGERIES    . WISDOM TOOTH EXTRACTION       OB History    Gravida  1   Para  1   Term  0   Preterm  1   AB  0   Living  1     SAB  0   TAB  0   Ectopic  0   Multiple  0   Live Births  1           Family History  Problem Relation Age of Onset  . Drug abuse Maternal Grandmother   . Hypercholesterolemia Mother   . Anxiety disorder Neg Hx   . Bipolar disorder Neg Hx   . Depression Neg Hx   . Alcohol abuse Neg Hx   . Suicidality Neg Hx     Social History   Tobacco Use  . Smoking status: Never Smoker  . Smokeless tobacco: Never Used  Substance Use Topics  . Alcohol use: Yes    Alcohol/week: 1.0 standard drinks    Types: 1 Glasses of wine per week  . Drug use: No    Home Medications Prior to Admission medications   Medication Sig Start Date End Date Taking? Authorizing Provider  norelgestromin-ethinyl estradiol Burr Medico) 150-35 MCG/24HR transdermal patch Place 1 patch onto the skin once a week. Patient taking differently: Place  1 patch onto the skin every Sunday.  04/27/19  Yes Verner Chol, CNM    Allergies    Flagyl [metronidazole]  Review of Systems   Review of Systems  Constitutional: Negative for chills, fatigue and fever.  HENT: Negative for sore throat.   Respiratory: Negative for shortness of breath.   Cardiovascular: Negative for chest pain.  Gastrointestinal: Positive for abdominal pain. Negative for anorexia, flatus, hematemesis, melena and nausea.  Genitourinary: Negative for dysuria.  Neurological: Negative for dizziness.  Psychiatric/Behavioral: Negative for agitation.  All other systems reviewed and are negative.   Physical Exam Updated Vital Signs BP 112/76   Pulse 79   Temp 98.4 F (36.9 C) (Oral)   Resp 16   Ht 5\' 5"  (1.651 m)   Wt 68.9 kg   LMP 05/24/2019 Comment: negative preg test  SpO2 100%    BMI 25.29 kg/m   Physical Exam Vitals and nursing note reviewed.  Constitutional:      Appearance: Diane Craig is well-developed.  HENT:     Head: Normocephalic and atraumatic.     Nose: Nose normal.  Eyes:     Conjunctiva/sclera: Conjunctivae normal.     Pupils: Pupils are equal, round, and reactive to light.  Cardiovascular:     Rate and Rhythm: Normal rate and regular rhythm.     Pulses: Normal pulses.     Heart sounds: Normal heart sounds.  Pulmonary:     Effort: Pulmonary effort is normal.     Breath sounds: Normal breath sounds.  Abdominal:     General: Abdomen is flat. Bowel sounds are normal.     Tenderness: There is no abdominal tenderness. There is no guarding.  Musculoskeletal:        General: Normal range of motion.     Cervical back: Normal range of motion and neck supple.  Skin:    General: Skin is warm and dry.     Capillary Refill: Capillary refill takes less than 2 seconds.  Neurological:     General: No focal deficit present.     Mental Status: Diane Craig is alert and oriented to person, place, and time.     Deep Tendon Reflexes: Reflexes normal.  Psychiatric:        Mood and Affect: Mood normal.        Behavior: Behavior normal.     ED Results / Procedures / Treatments   Labs (all labs ordered are listed, but only abnormal results are displayed) Results for orders placed or performed during the hospital encounter of 05/29/19  Lipase, blood  Result Value Ref Range   Lipase 31 11 - 51 U/L  Comprehensive metabolic panel  Result Value Ref Range   Sodium 137 135 - 145 mmol/L   Potassium 4.3 3.5 - 5.1 mmol/L   Chloride 106 98 - 111 mmol/L   CO2 24 22 - 32 mmol/L   Glucose, Bld 99 70 - 99 mg/dL   BUN 8 6 - 20 mg/dL   Creatinine, Ser 05/31/19 (H) 0.44 - 1.00 mg/dL   Calcium 9.1 8.9 - 3.71 mg/dL   Total Protein 7.4 6.5 - 8.1 g/dL   Albumin 3.8 3.5 - 5.0 g/dL   AST 19 15 - 41 U/L   ALT 29 0 - 44 U/L   Alkaline Phosphatase 41 38 - 126 U/L   Total Bilirubin 0.4 0.3  - 1.2 mg/dL   GFR calc non Af Amer >60 >60 mL/min   GFR calc Af Amer >60 >60 mL/min  Anion gap 7 5 - 15  CBC  Result Value Ref Range   WBC 4.4 4.0 - 10.5 K/uL   RBC 4.72 3.87 - 5.11 MIL/uL   Hemoglobin 12.4 12.0 - 15.0 g/dL   HCT 39.6 36.0 - 46.0 %   MCV 83.9 80.0 - 100.0 fL   MCH 26.3 26.0 - 34.0 pg   MCHC 31.3 30.0 - 36.0 g/dL   RDW 12.6 11.5 - 15.5 %   Platelets 251 150 - 400 K/uL   nRBC 0.0 0.0 - 0.2 %  Urinalysis, Routine w reflex microscopic  Result Value Ref Range   Color, Urine STRAW (A) YELLOW   APPearance CLEAR CLEAR   Specific Gravity, Urine 1.003 (L) 1.005 - 1.030   pH 6.0 5.0 - 8.0   Glucose, UA NEGATIVE NEGATIVE mg/dL   Hgb urine dipstick LARGE (A) NEGATIVE   Bilirubin Urine NEGATIVE NEGATIVE   Ketones, ur NEGATIVE NEGATIVE mg/dL   Protein, ur NEGATIVE NEGATIVE mg/dL   Nitrite NEGATIVE NEGATIVE   Leukocytes,Ua NEGATIVE NEGATIVE   RBC / HPF 21-50 0 - 5 RBC/hpf   WBC, UA 0-5 0 - 5 WBC/hpf   Bacteria, UA RARE (A) NONE SEEN   Squamous Epithelial / LPF 0-5 0 - 5  I-Stat beta hCG blood, ED  Result Value Ref Range   I-stat hCG, quantitative <5.0 <5 mIU/mL   Comment 3           DG Abdomen Acute W/Chest  Result Date: 05/30/2019 CLINICAL DATA:  Constipation, abdominal pain discomfort EXAM: DG ABDOMEN ACUTE W/ 1V CHEST COMPARISON:  None. FINDINGS: No consolidation, features of edema, pneumothorax, or effusion. The cardiomediastinal contours are unremarkable. No subdiaphragmatic free air. Few clustered air-filled loops of small bowel are noted in the left upper quadrant without frank distention. There is a moderate stool burden with an air-filled splenic flexure. Air and stool overlies the rectal vault. No suspicious calcifications over the renal shadows. Cholecystectomy clips noted in the right upper quadrant. IMPRESSION: 1. No acute cardiopulmonary findings. 2. Moderate stool burden. 3. Few clustered air-filled loops of small bowel in the left upper quadrant without frank  distention, nonspecific but unlikely to reflect a high-grade obstruction. Electronically Signed   By: Lovena Le M.D.   On: 05/30/2019 03:48    Radiology DG Abdomen Acute W/Chest  Result Date: 05/30/2019 CLINICAL DATA:  Constipation, abdominal pain discomfort EXAM: DG ABDOMEN ACUTE W/ 1V CHEST COMPARISON:  None. FINDINGS: No consolidation, features of edema, pneumothorax, or effusion. The cardiomediastinal contours are unremarkable. No subdiaphragmatic free air. Few clustered air-filled loops of small bowel are noted in the left upper quadrant without frank distention. There is a moderate stool burden with an air-filled splenic flexure. Air and stool overlies the rectal vault. No suspicious calcifications over the renal shadows. Cholecystectomy clips noted in the right upper quadrant. IMPRESSION: 1. No acute cardiopulmonary findings. 2. Moderate stool burden. 3. Few clustered air-filled loops of small bowel in the left upper quadrant without frank distention, nonspecific but unlikely to reflect a high-grade obstruction. Electronically Signed   By: Lovena Le M.D.   On: 05/30/2019 03:48    Procedures Procedures (including critical care time)  Medications Ordered in ED Medications  sodium chloride flush (NS) 0.9 % injection 3 mL (has no administration in time range)  iohexol (OMNIPAQUE) 300 MG/ML solution 100 mL (has no administration in time range)  alum & mag hydroxide-simeth (MAALOX/MYLANTA) 200-200-20 MG/5ML suspension 30 mL (30 mLs Oral Given 05/30/19 0310)  And  lidocaine (XYLOCAINE) 2 % viscous mouth solution 15 mL (15 mLs Oral Given 05/30/19 0310)    ED Course  I have reviewed the triage vital signs and the nursing notes.  Pertinent labs & imaging results that were available during my care of the patient were reviewed by me and considered in my medical decision making (see chart for details).   Doing well post medication.  Symptoms consistent with GERD and constipation.  Will  treat for same.  Seng Fouts was evaluated in Emergency Department on 05/30/2019 for the symptoms described in the history of present illness. Diane Craig was evaluated in the context of the global COVID-19 pandemic, which necessitated consideration that the patient might be at risk for infection with the SARS-CoV-2 virus that causes COVID-19. Institutional protocols and algorithms that pertain to the evaluation of patients at risk for COVID-19 are in a state of rapid change based on information released by regulatory bodies including the CDC and federal and state organizations. These policies and algorithms were followed during the patient's care in the ED.   Final Clinical Impression(s) / ED Diagnoses  Return for weakness, numbness, changes in vision or speech, fevers >100.4 unrelieved by medication, shortness of breath, intractable vomiting, or diarrhea, abdominal pain, Inability to tolerate liquids or food, cough, altered mental status or any concerns. No signs of systemic illness or infection. The patient is nontoxic-appearing on exam and vital signs are within normal limits.   I have reviewed the triage vital signs and the nursing notes. Pertinent labs &imaging results that were available during my care of the patient were reviewed by me and considered in my medical decision making (see chart for details).  After history, exam, and medical workup I feel the patient has been appropriately medically screened and is safe for discharge home. Pertinent diagnoses were discussed with the patient. Patient was given return precautions    Verle Wheeling, MD 05/30/19 [phone]

## 2019-05-30 NOTE — ED Notes (Signed)
Pt discharge instructions and prescriptions reviewed with the patient. The patient verbalized understanding of both. Pt discharged. 

## 2019-07-21 NOTE — Progress Notes (Signed)
GYNECOLOGY  VISIT  CC:   Gonorrhea TOC  HPI: 34 y.o. [id number] [name] female here for toc of gonorrhea that was diagnosed 04/2019.  Pt did receive treatment and partner did as well.  They are still together.  Denies any pelvic pain, irregular bleeding or vaginal discharge.  She uses Xulane patch for birth control.  Cycles are regular.    HIV, syphilis testing and Hep C were all negative in Feb.  GYNECOLOGIC HISTORY: Patient's last menstrual period was 07/20/2019 (exact date). Contraception: Xulane patch Menopausal hormone therapy: none  Patient Active Problem List   Diagnosis Date Noted  . 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 Anxiety 12/11/2016  . 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 Major depressive disorder, single episode, mild ([place]) 04/22/2014    Past Medical History:  Diagnosis Date  . Abnormal Pap smear 2009   COLPO  LAST PAP 03/2011  . Anxiety   . Depression   . 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 Preterm delivery without spontaneous labor 01/09/2012   Induction for Preeclampsia    Past Surgical History:  Procedure Laterality Date  . CHOLECYSTECTOMY  01/2019  . COLPOSCOPY  2013  . NO PAST SURGERIES    . WISDOM TOOTH EXTRACTION      MEDS:   Current Outpatient Medications on File Prior to Visit  Medication Sig Dispense Refill  . norelgestromin-ethinyl estradiol Diane Craig) 150-35 MCG/24HR transdermal patch Place 1 patch onto the skin once a week. (Patient taking differently: Place 1 patch onto the skin every Sunday. ) 3 patch 11   No current facility-administered medications on file prior to visit.    ALLERGIES: Flagyl [metronidazole]  Family History  Problem Relation Age of Onset  . Drug abuse Maternal Grandmother   . Hypercholesterolemia Mother   . Anxiety disorder Neg Hx   . Bipolar disorder Neg Hx   . Depression Neg Hx   . Alcohol abuse  Neg Hx   . Suicidality Neg Hx     SH:  Single, non smoker  Review of Systems  Constitutional: Negative.   HENT: Negative.   Eyes: Negative.   Respiratory: Negative.   Cardiovascular: Negative.   Gastrointestinal: Negative.   Endocrine: Negative.   Genitourinary: Negative.   Musculoskeletal: Negative.   Skin: Negative.   Allergic/Immunologic: Negative.   Neurological: Negative.   Hematological: Negative.   Psychiatric/Behavioral: Negative.     PHYSICAL EXAMINATION:    BP 114/70   Pulse 68   Temp 97.9 F (36.6 C) (Skin)   Resp 16   Wt 138 lb (62.6 kg)   LMP 07/20/2019 (Exact Date)   BMI 22.96 kg/m     General appearance: alert, cooperative and appears stated age Lymph:  no inguinal LAD noted  Pelvic: External genitalia:  no lesions              Urethra:  normal appearing urethra with no masses, tenderness or lesions              Bartholins and Skenes: normal                 Vagina: normal appearing vagina with normal color and discharge, no lesions              Cervix: no lesions              Bimanual Exam:  Uterus:  normal size, contour, position,  consistency, mobility, non-tender              Adnexa: no mass, fullness, tenderness             Chaperone, [name], CMA, was present for exam.  Assessment: H/o gonorrhea that was treated 04/27/2019  Plan: GC/Chl repeat testing obtained today.  Pt will be called with results.  Questions answered.

## 2019-08-03 ENCOUNTER — Ambulatory Visit: Payer: Self-pay | Admitting: Certified Nurse Midwife

## 2019-08-03 ENCOUNTER — Encounter: Payer: Self-pay | Admitting: Obstetrics & Gynecology

## 2019-08-03 ENCOUNTER — Other Ambulatory Visit: Payer: Self-pay

## 2019-08-03 ENCOUNTER — Ambulatory Visit (INDEPENDENT_AMBULATORY_CARE_PROVIDER_SITE_OTHER): Payer: Medicaid Other | Admitting: Obstetrics & Gynecology

## 2019-08-03 VITALS — BP 114/70 | HR 68 | Temp 97.9°F | Resp 16 | Wt 138.0 lb

## 2019-08-03 DIAGNOSIS — Z8619 Personal history of other infectious and parasitic diseases: Secondary | ICD-10-CM

## 2019-08-04 LAB — GC/CHLAMYDIA PROBE AMP
Chlamydia trachomatis, NAA: NEGATIVE
Neisseria Gonorrhoeae by PCR: NEGATIVE

## 2019-08-06 ENCOUNTER — Encounter: Payer: Self-pay | Admitting: Obstetrics & Gynecology

## 2019-09-25 ENCOUNTER — Other Ambulatory Visit: Payer: Self-pay

## 2019-09-25 ENCOUNTER — Encounter: Payer: Self-pay | Admitting: Allergy

## 2019-09-25 ENCOUNTER — Ambulatory Visit (INDEPENDENT_AMBULATORY_CARE_PROVIDER_SITE_OTHER): Payer: Medicaid Other | Admitting: Allergy

## 2019-09-25 VITALS — BP 106/58 | HR 82 | Resp 17 | Ht 64.0 in | Wt 139.2 lb

## 2019-09-25 DIAGNOSIS — J3089 Other allergic rhinitis: Secondary | ICD-10-CM

## 2019-09-25 DIAGNOSIS — H1013 Acute atopic conjunctivitis, bilateral: Secondary | ICD-10-CM

## 2019-09-25 DIAGNOSIS — K9049 Malabsorption due to intolerance, not elsewhere classified: Secondary | ICD-10-CM | POA: Diagnosis not present

## 2019-09-25 DIAGNOSIS — T50905D Adverse effect of unspecified drugs, medicaments and biological substances, subsequent encounter: Secondary | ICD-10-CM

## 2019-09-25 DIAGNOSIS — K2100 Gastro-esophageal reflux disease with esophagitis, without bleeding: Secondary | ICD-10-CM

## 2019-09-25 MED ORDER — AZELASTINE HCL 0.1 % NA SOLN: NASAL | 5 refills | Status: DC

## 2019-09-25 MED ORDER — OLOPATADINE HCL 0.2 % OP SOLN: OPHTHALMIC | 5 refills | Status: DC

## 2019-09-25 NOTE — Progress Notes (Signed)
New Patient Note  RE: Diane Craig MRN: [id number] DOB: 1985/03/18 Date of Office Visit: 09/25/2019  Referring provider: Salli Real, MD Primary care provider: Luciano Cutter, PA-C  Chief Complaint: allergies  History of present illness: Diane Craig is a 34 y.o. female presenting today for consultation for allergies.    She has been having a "agitated throat" and sometimes feels like its a bit difficult to swallow.  Doesn't report soreness or pain of throat. She states these issues seem to occur after having a reaction to flagyl.   She states she had an allergic reaction to flagyl and felt like she was having trouble breathing after taking first dose of flagyl orally.  She states she felt her breathing was difficult like her throat felt tighter.  She states after several hours the throat sensation lessened. She took a dose the next day and reports developing similar symptoms as day prior.  She called the prescriber and states she was recommended to use a topical preparation instead.  This occurred in March 2021.  She has not had flagyl since.  However she still feels that she is having throat symptoms since this time.   She sees a GI and did have a endoscopy since having the issue with flagyl and states everything was normal from the endoscopy which was performed about 1-2 months ago.  She has been advised to take pepcid.    She would like to have allergy testing performed today.  She states during spring she experiences sneezing, itch/watery eyes, runny nose.  She will take Claritin as needed which does help. She states she feels like her ears "drain" a lot all the time as well.  She states she does have history of reflux that is triggered by foods like pasta sauce, pizza.  She states this has been an issue since she had gallbladder removal.  She states she will use "digestive shots" that contains items like tumeric and other natural products to help with reflux control.    No  history of asthma or eczema.   She states with shrimp she would develop mouth itch and swelling; this occurred in high school.  However she states she can eat popcorn shrimp without issue but doesn't eat regularly.  She does recall in the past couple years she has had regular shrimp and noticed the mouth itch and swelling.      Review of systems: Review of Systems  Constitutional: Negative.   HENT: Negative.   Eyes: Negative.   Respiratory: Negative.   Cardiovascular: Negative.   Gastrointestinal: Positive for heartburn.  Musculoskeletal: Negative.   Skin: Negative.   Neurological: Negative.     All other systems negative unless noted above in HPI  Past medical history: Past Medical History:  Diagnosis Date  . Abnormal Pap smear 2009   COLPO  LAST PAP 03/2011  . Anxiety   . Depression   . 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 Preterm delivery without spontaneous labor 01/09/2012   Induction for Preeclampsia    Past surgical history: Past Surgical History:  Procedure Laterality Date  . CHOLECYSTECTOMY  01/2019  . COLPOSCOPY  2013  . NO PAST SURGERIES    . WISDOM TOOTH EXTRACTION      Family history:  Family History  Problem Relation Age of Onset  . Drug abuse Maternal Grandmother   . Hypercholesterolemia Mother   . Anxiety disorder Neg Hx   .  Bipolar disorder Neg Hx   . Depression Neg Hx   . Alcohol abuse Neg Hx   . Suicidality Neg Hx     Social history: Lives in a home with carpeting in the bedroom with gas heating and central cooling.  No pets in the home.  No concern for water damage, mildew or roaches in the home.  She is a HR specialist.  Denies a smoking history.  Medication List: Current Outpatient Medications  Medication Sig Dispense Refill  . ketoconazole (NIZORAL) 2 % shampoo Apply topically 3 (three) times a week.    . loratadine (CLARITIN) 10 MG tablet Take 10 mg by mouth daily as needed  for allergies.    Marland Kitchen norelgestromin-ethinyl estradiol [facility]r Medico) 150-35 MCG/24HR transdermal patch Place 1 patch onto the skin once a week. (Patient taking differently: Place 1 patch onto the skin every Sunday. ) 3 patch 11   No current facility-administered medications for this visit.    Known medication allergies: Allergies  Allergen Reactions  . Flagyl [Metronidazole] Swelling     Physical examination: Blood pressure (!) 106/58, pulse 82, resp. rate 17, height 5\' 4"  (1.626 m), weight 139 lb 3.2 oz (63.1 kg), SpO2 98 %.  General: Alert, interactive, in no acute distress. HEENT: PERRLA, TMs pearly gray, turbinates moderately edematous without discharge, post-pharynx non erythematous. Neck: Supple without lymphadenopathy. Lungs: Clear to auscultation without wheezing, rhonchi or rales. {no increased work of breathing. CV: Normal S1, S2 without murmurs. Abdomen: Nondistended, nontender. Skin: Warm and dry, without lesions or rashes. Extremities:  No clubbing, cyanosis or edema. Neuro:   Grossly intact.  Diagnositics/Labs:  Allergy testing: Environmental allergy skin prick testing is positive to grass pollens, red cedar, Curvularia, both dust mites and cockroach. Select food allergy skin prick testing is negative. Allergy testing results were read and interpreted by provider, documented by clinical staff.   Assessment and plan:   Allergic rhinitis with conjunctivitis  -Environmental allergy testing is positive to grass pollen, tree pollen, mold, dust mite and cockroach  -Allergen avoidance measures discussed/handouts provided  -Continue use of a long-acting antihistamine like Claritin 10 mg daily as needed.  Can also use Zyrtec, Xyzal or Allegra as other over-the-counter options.  -For itchy, watery or red eyes use olopatadine 0.2% 1 drop each eye daily as needed  -For nasal drainage recommend use of nasal Astelin 2 sprays each nostril twice a day as needed.  This also helps  decrease any postnasal drip which could lead to throat irritation  Intolerance, food  -Select food allergy skin testing is negative.  Thus you do not have IgE mediated food allergy.   - we have discussed the following in regards to foods:   Allergy: food allergy is when you have eaten a food, developed an allergic reaction after eating the food and have IgE to the food (positive food testing either by skin testing or blood testing).  Food allergy could lead to life threatening symptoms  Sensitivity: occurs when you have IgE to a food (positive food testing either by skin testing or blood testing) but is a food you eat without any issues.  This is not an allergy and we recommend keeping the food in the diet  Intolerance: this is when you have negative testing by either skin testing or blood testing thus not allergic but the food causes symptoms (like belly pain, bloating, diarrhea etc) with ingestion.  These foods should be avoided to prevent symptoms.    -should significant symptoms recur or new  symptoms occur, a journal is to be kept recording any foods eaten, beverages consumed, medications taken, activities performed, and environmental conditions within a 6 hour time period prior to the onset of symptoms.   GERD  -Continue food avoidance that trigger reflux symptoms namely tomato based products  -If symptoms persist despite dietary modifications then can try over-the-counter Pepcid 20 mg twice a day.  Pepcid also has an antihistamine response.   Adverse medication effect  -Symptoms following use of Flagyl.  Symptoms at this time do not sound to be IgE mediated however would want to rule this out before recommending it is safe to use this medication again.  Thus would recommend a challenge however would like for at least 6 to 12 months to elapse before attempting a challenge in office.  Follow-up 6 months or sooner if needed  I appreciate the opportunity to take part in Zakya's care. Please  do not hesitate to contact me with questions.  Sincerely,   Margo Aye, MD Allergy/Immunology [facility]

## 2019-09-25 NOTE — Patient Instructions (Addendum)
 -  Environmental allergy testing is positive to grass pollen, tree pollen, mold, dust mite and cockroach  -Allergen avoidance measures discussed/handouts provided  -Continue use of a long-acting antihistamine like Claritin 10 mg daily as needed.  Can also use Zyrtec, Xyzal or Allegra as other over-the-counter options.  -For itchy, watery or red eyes use olopatadine 0.2% 1 drop each eye daily as needed  -For nasal drainage recommend use of nasal Astelin 2 sprays each nostril twice a day as needed.  This also helps decrease any postnasal drip which could lead to throat irritation   -Select food allergy skin testing is negative.  Thus you do not have IgE mediated food allergy.   - we have discussed the following in regards to foods:   Allergy: food allergy is when you have eaten a food, developed an allergic reaction after eating the food and have IgE to the food (positive food testing either by skin testing or blood testing).  Food allergy could lead to life threatening symptoms  Sensitivity: occurs when you have IgE to a food (positive food testing either by skin testing or blood testing) but is a food you eat without any issues.  This is not an allergy and we recommend keeping the food in the diet  Intolerance: this is when you have negative testing by either skin testing or blood testing thus not allergic but the food causes symptoms (like belly pain, bloating, diarrhea etc) with ingestion.  These foods should be avoided to prevent symptoms.    -should significant symptoms recur or new symptoms occur, a journal is to be kept recording any foods eaten, beverages consumed, medications taken, activities performed, and environmental conditions within a 6 hour time period prior to the onset of symptoms.    -Continue food avoidance that trigger reflux symptoms namely tomato based products  -If symptoms persist despite dietary modifications then can try over-the-counter Pepcid 20 mg twice a day.  Pepcid  also has an antihistamine response.    -Symptoms following use of Flagyl.  Symptoms at this time do not sound to be IgE mediated however would want to rule this out before recommending it is safe to use this medication again.  Thus would recommend a challenge however would like for at least 6 to 12 months to elapse before attempting a challenge in office.  Follow-up 6 months or sooner if needed

## 2020-04-27 ENCOUNTER — Ambulatory Visit: Payer: Commercial Managed Care - PPO | Admitting: Certified Nurse Midwife

## 2020-05-02 ENCOUNTER — Ambulatory Visit: Payer: Medicaid Other

## 2020-10-31 ENCOUNTER — Other Ambulatory Visit: Payer: Self-pay

## 2020-10-31 DIAGNOSIS — Z3045 Encounter for surveillance of transdermal patch hormonal contraceptive device: Secondary | ICD-10-CM

## 2020-10-31 MED ORDER — XULANE 150-35 MCG/24HR TD PTWK: 1.0000 | MEDICATED_PATCH | TRANSDERMAL | 0 refills | Status: DC

## 2020-10-31 NOTE — Telephone Encounter (Signed)
Patient called for refill on her generic Xulane patch.  Last AEX 04/27/19 with Leota Sauers, CNM Scheduled for 12/31/20 with Wyline Beady, NP

## 2020-12-14 ENCOUNTER — Encounter: Payer: Self-pay | Admitting: Nurse Practitioner

## 2020-12-14 ENCOUNTER — Ambulatory Visit (INDEPENDENT_AMBULATORY_CARE_PROVIDER_SITE_OTHER): Payer: Medicaid Other | Admitting: Nurse Practitioner

## 2020-12-14 ENCOUNTER — Other Ambulatory Visit: Payer: Self-pay

## 2020-12-14 VITALS — BP 116/74 | Ht 65.0 in | Wt 164.0 lb

## 2020-12-14 DIAGNOSIS — Z113 Encounter for screening for infections with a predominantly sexual mode of transmission: Secondary | ICD-10-CM | POA: Diagnosis not present

## 2020-12-14 DIAGNOSIS — Z01419 Encounter for gynecological examination (general) (routine) without abnormal findings: Secondary | ICD-10-CM | POA: Diagnosis not present

## 2020-12-14 DIAGNOSIS — Z3045 Encounter for surveillance of transdermal patch hormonal contraceptive device: Secondary | ICD-10-CM | POA: Diagnosis not present

## 2020-12-14 MED ORDER — XULANE 150-35 MCG/24HR TD PTWK: 1.0000 | MEDICATED_PATCH | TRANSDERMAL | 4 refills | Status: DC

## 2020-12-14 NOTE — Progress Notes (Signed)
   Diane Craig 25-Jul-1985 [id number]   History:  35 y.o. G1P0101 presents for annual exam. Monthly cycles. Ortho evra patches. 2013 colposcopy, subsequent paps normal.   Gynecologic History Patient's last menstrual period was 12/12/2020. Period Cycle (Days): 28 Period Duration (Days): 5 Period Pattern: Regular Menstrual Flow: Light Dysmenorrhea: (!) Mild Dysmenorrhea Symptoms: Cramping Contraception/Family planning: Ortho-Evra patches weekly Sexually active: Yes  Health Maintenance Last Pap: 12/17/2017. Results were: Normal, 5-year repeat Last mammogram: Not indicated Last colonoscopy: Not indicated Last Dexa: Not indicated  Past medical history, past surgical history, family history and social history were all reviewed and documented in the EPIC chart. Owns trucking business. 33 yo daughter.   ROS:  A ROS was performed and pertinent positives and negatives are included.  Exam:  Vitals:   12/14/20 0902  BP: 116/74  Weight: 164 lb (74.4 kg)  Height: 5\' 5"  (1.651 m)   Body mass index is 27.29 kg/m.  General appearance:  Normal Thyroid:  Symmetrical, normal in size, without palpable masses or nodularity. Respiratory  Auscultation:  Clear without wheezing or rhonchi Cardiovascular  Auscultation:  Regular rate, without rubs, murmurs or gallops  Edema/varicosities:  Not grossly evident Abdominal  Soft,nontender, without masses, guarding or rebound.  Liver/spleen:  No organomegaly noted  Hernia:  None appreciated  Skin  Inspection:  Grossly normal Breasts: Examined lying and sitting.   Right: Without masses, retractions, nipple discharge or axillary adenopathy.   Left: Without masses, retractions, nipple discharge or axillary adenopathy. Genitourinary   Inguinal/mons:  Normal without inguinal adenopathy  External genitalia:  Normal appearing vulva with no masses, tenderness, or lesions  BUS/Urethra/Skene's glands:  Normal  Vagina:  Normal appearing with normal color  and discharge, no lesions  Cervix:  Normal appearing without discharge or lesions  Uterus:  Normal in size, shape and contour.  Midline and mobile, nontender  Adnexa/parametria:     Rt: Normal in size, without masses or tenderness.   Lt: Normal in size, without masses or tenderness.  Anus and perineum: Normal  Patient informed chaperone available to be present for breast and pelvic exam. Patient has requested no chaperone to be present. Patient has been advised what will be completed during breast and pelvic exam.   Assessment/Plan:  35 y.o. G1P0101 for annual exam.   Well female exam with routine gynecological exam - Education provided on SBEs, importance of preventative screenings, current guidelines, high calcium diet, regular exercise, and multivitamin daily. Labs with PCP.  Encounter for surveillance of transdermal patch hormonal contraceptive device - Plan: norelgestromin-ethinyl estradiol 32) 150-35 MCG/24HR transdermal patch weekly. Using as prescribed. Doesn't love the adhesive, but had weight gain with nexplanon and Depo, cannot remember to take pills, and does not want anything vaginal (ring or IUD), so she wants to continue patch at this time. Refill x 1 year provided.   Screen for STD (sexually transmitted disease) - Plan: SURESWAB CT/NG/T. vaginalis, RPR, HIV Antibody (routine testing w rflx)  Screening for cervical cancer - 2013 colpo, negative per patient. Subsequent paps normal. Will repeat at 5-year interval per guidelines.   Return in 1 year for annual.   2014 DNP, 9:22 AM 12/14/2020

## 2020-12-15 LAB — RPR: RPR Ser Ql: NONREACTIVE

## 2020-12-15 LAB — HIV ANTIBODY (ROUTINE TESTING W REFLEX): HIV 1&2 Ab, 4th Generation: NONREACTIVE

## 2020-12-16 LAB — SURESWAB CT/NG/T. VAGINALIS
C. trachomatis RNA, TMA: NOT DETECTED
N. gonorrhoeae RNA, TMA: NOT DETECTED
Trichomonas vaginalis RNA: NOT DETECTED

## 2020-12-31 ENCOUNTER — Emergency Department (HOSPITAL_COMMUNITY)
Admission: EM | Admit: 2020-12-31 | Discharge: 2020-12-31 | Disposition: A | Discharge status: Home / Self Care | Payer: Medicaid Other | Attending: Emergency Medicine | Admitting: Emergency Medicine

## 2020-12-31 ENCOUNTER — Other Ambulatory Visit: Payer: Self-pay

## 2020-12-31 ENCOUNTER — Encounter (HOSPITAL_COMMUNITY): Payer: Self-pay | Admitting: Emergency Medicine

## 2020-12-31 DIAGNOSIS — M545 Low back pain, unspecified: Secondary | ICD-10-CM | POA: Insufficient documentation

## 2020-12-31 DIAGNOSIS — Y92410 Unspecified street and highway as the place of occurrence of the external cause: Secondary | ICD-10-CM | POA: Diagnosis not present

## 2020-12-31 DIAGNOSIS — M25511 Pain in right shoulder: Secondary | ICD-10-CM | POA: Insufficient documentation

## 2020-12-31 DIAGNOSIS — M542 Cervicalgia: Secondary | ICD-10-CM | POA: Insufficient documentation

## 2020-12-31 DIAGNOSIS — M25512 Pain in left shoulder: Secondary | ICD-10-CM | POA: Diagnosis not present
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
## 2021-02-19 IMAGING — CT CT ABD-PELV W/ CM
2 of 4 series · 16 of 46 positions shown, 18 images · IV contrast (APPLIED)
Comparison: None.

CLINICAL DATA: Worsening stomach pain for 8 days.

EXAM:
CT ABDOMEN AND PELVIS WITH CONTRAST
TECHNIQUE: Multidetector CT imaging of the abdomen and pelvis was performed
using the standard protocol following bolus administration of
intravenous contrast.
CONTRAST:  100mL OMNIPAQUE IOHEXOL 300 MG/ML  SOLN

[Series 3: abdomen 5.0 · axial · 0.62mm/px · z∈[+897,+1282]mm · 13 of 87 slices shown, 15 images]
[im 5/87  soft-tissue]
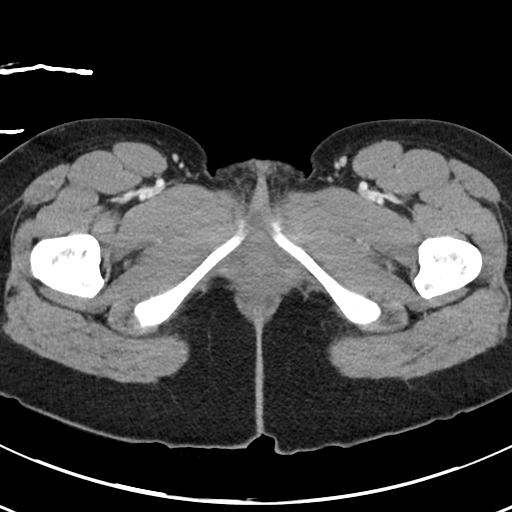
[im 5/87  bone]
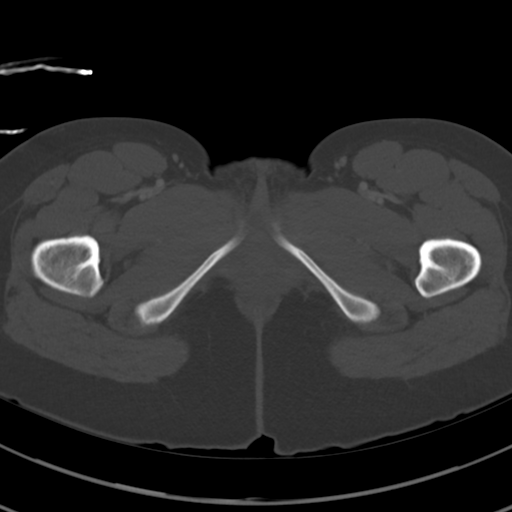
[im 10/87  soft-tissue]
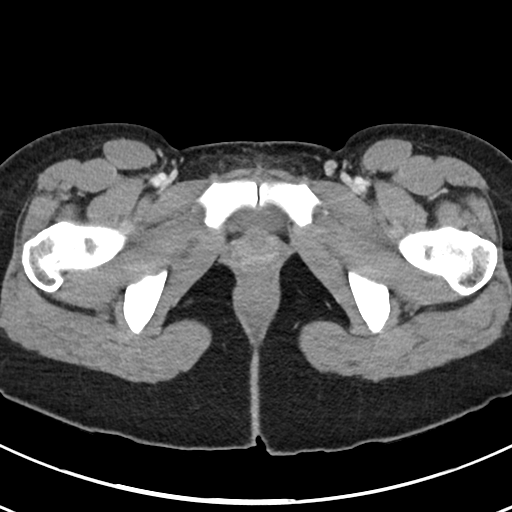
[im 20/87  soft-tissue]
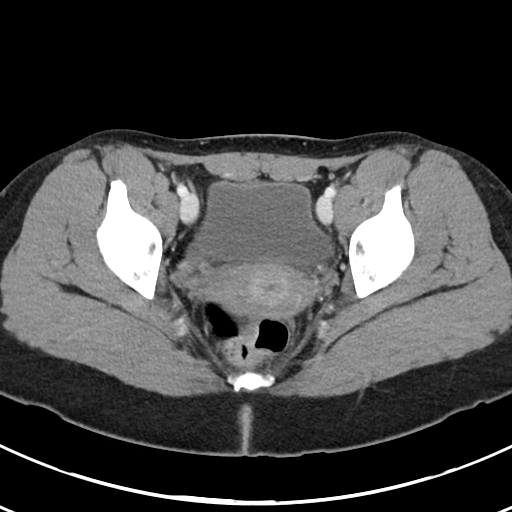
[im 24/87  soft-tissue]
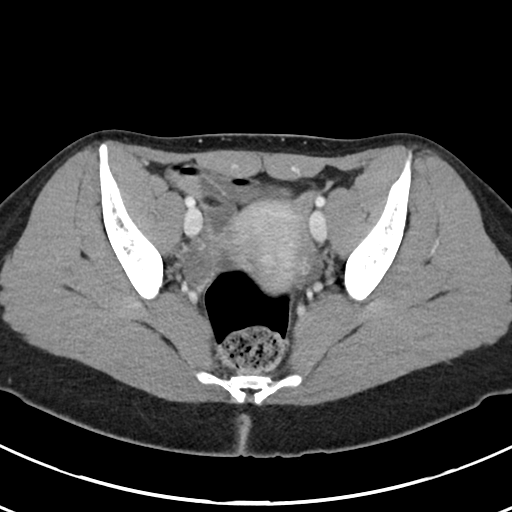
[im 29/87  soft-tissue]
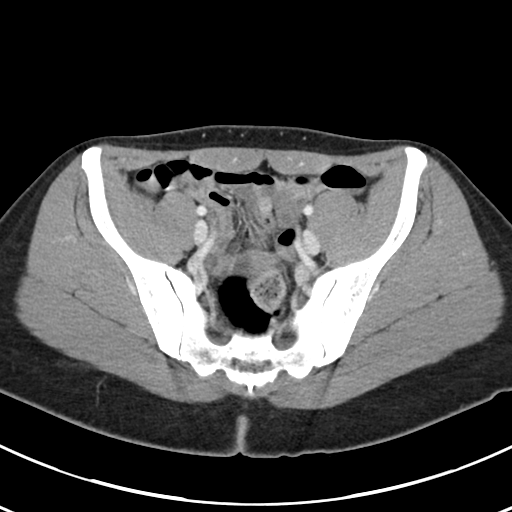
[im 39/87  soft-tissue]
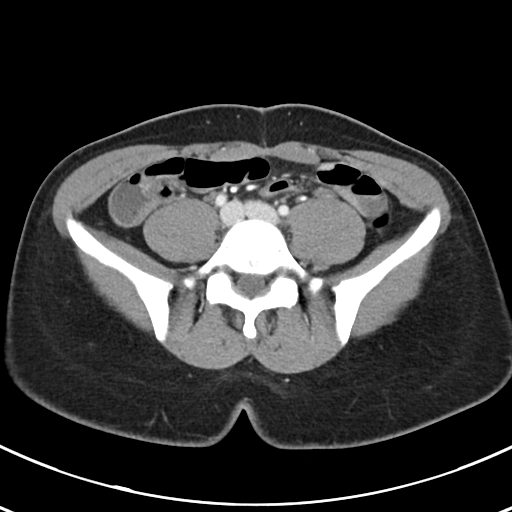
[im 44/87  soft-tissue]
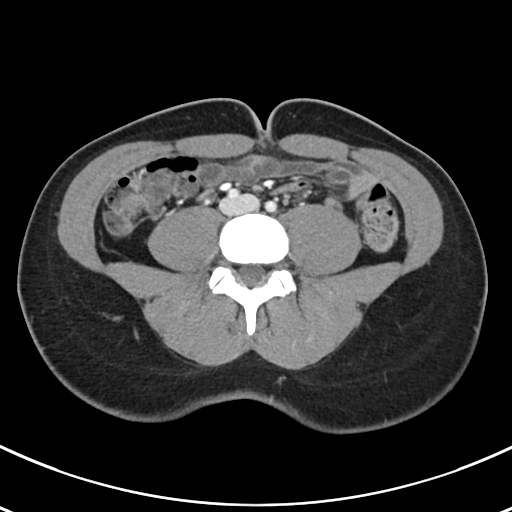
[im 48/87  soft-tissue]
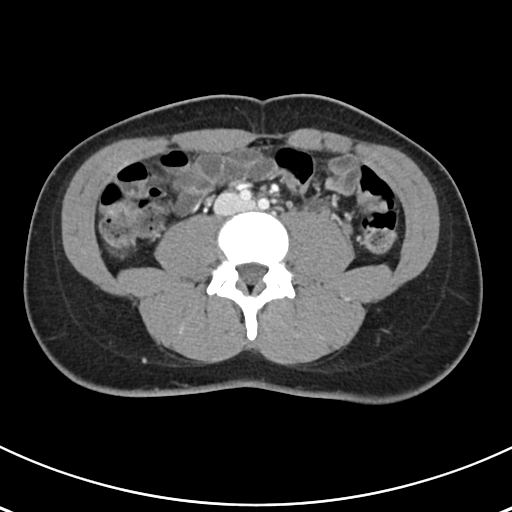
[im 58/87  soft-tissue]
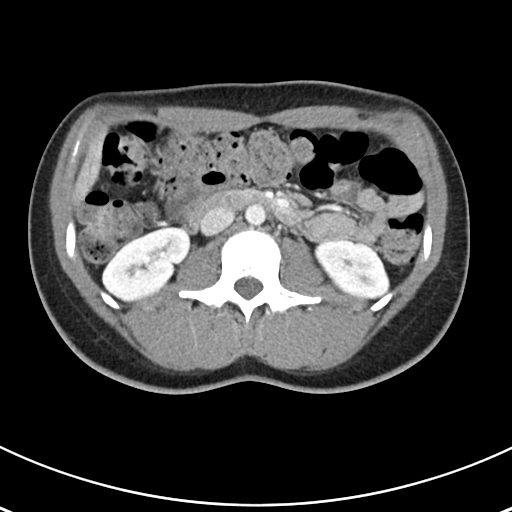
[im 58/87  bone]
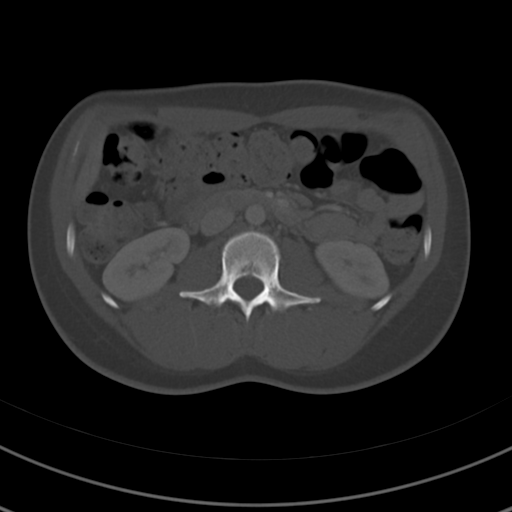
[im 63/87  soft-tissue]
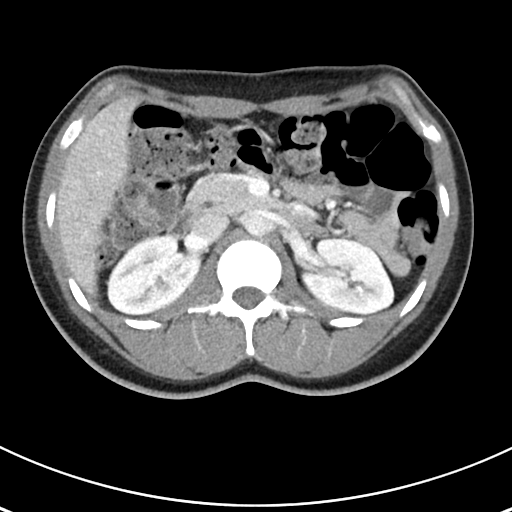
[im 67/87  soft-tissue]
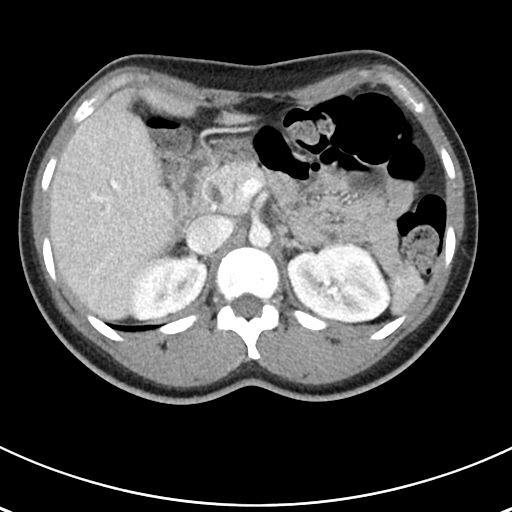
[im 77/87  soft-tissue]
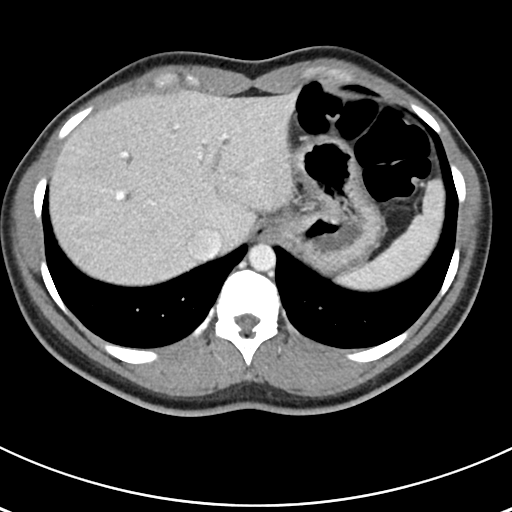
[im 82/87  soft-tissue]
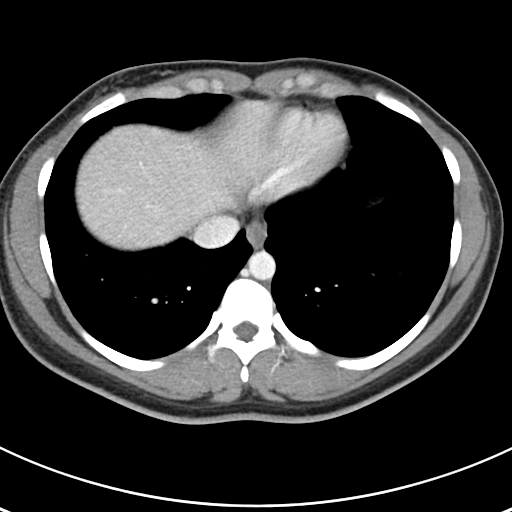

[Series 6: abdomen 3.0 mpr cor · coronal · 0.71mm/px · 3 of 73 slices shown]
[im 25/73  soft-tissue]
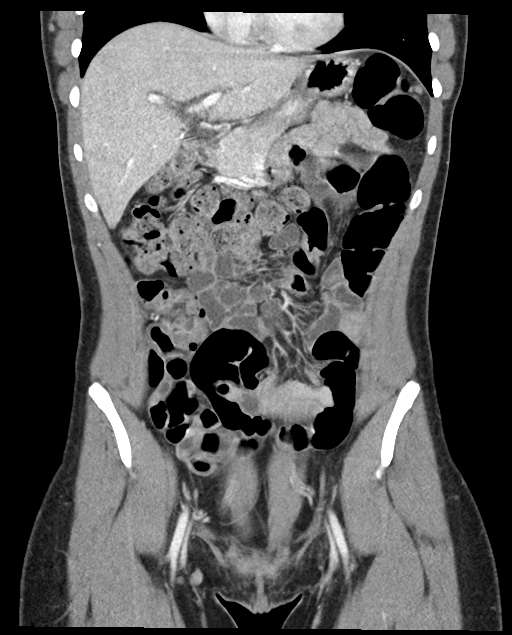
[im 33/73  soft-tissue]
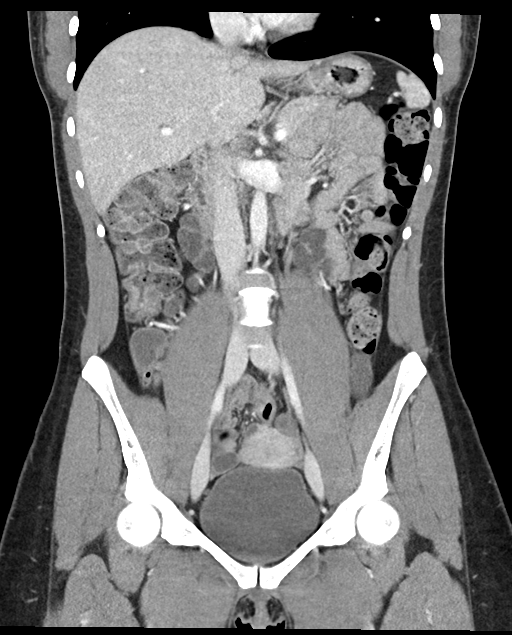
[im 41/73  soft-tissue]
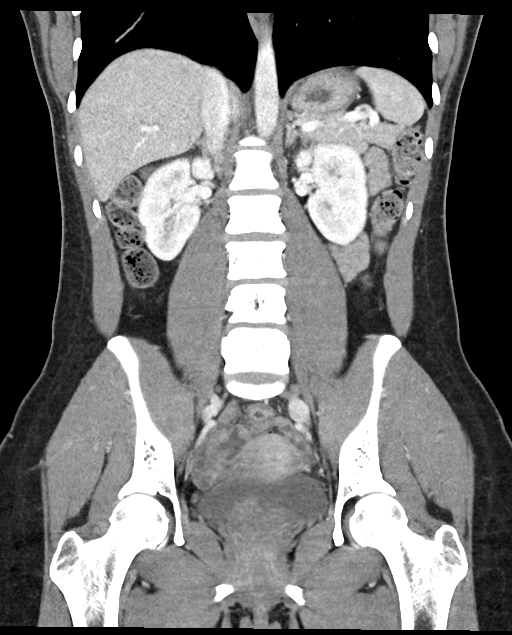

[16 of 46 positions shown; findings below may reference images not displayed]

FINDINGS: Lower chest: No acute abnormality.

Hepatobiliary: No focal liver abnormality is seen. Status post
cholecystectomy. No biliary dilatation.

Pancreas: Unremarkable. No pancreatic ductal dilatation or
surrounding inflammatory changes.

Spleen: Normal in size without focal abnormality.

Adrenals/Urinary Tract: Evaluation for stones is limited given
contrast. However, I suspect there are couple of bilateral stones
with a representative stone on coronal image 48 measuring 3 mm. No
hydronephrosis or perinephric stranding. No suspicious mass
identified. No ureterectasis or ureteral stone. The bladder is
normal.

Stomach/Bowel: Stomach is within normal limits. Appendix appears
normal. No evidence of bowel wall thickening, distention, or
inflammatory changes.

Vascular/Lymphatic: No significant vascular findings are present. No
enlarged abdominal or pelvic lymph nodes.

Reproductive: Uterus and bilateral adnexa are unremarkable.

Other: No abdominal wall hernia or abnormality. No abdominopelvic
ascites.

Musculoskeletal: No acute or significant osseous findings.
IMPRESSION: 1. No acute abnormalities to explain the patient's symptoms.
2. There appear to be a couple of nonobstructive stones in the
kidneys.

## 2021-07-31 ENCOUNTER — Other Ambulatory Visit: Payer: Self-pay

## 2021-07-31 ENCOUNTER — Emergency Department (HOSPITAL_COMMUNITY)
Admission: EM | Admit: 2021-07-31 | Discharge: 2021-07-31 | Disposition: A | Discharge status: Home / Self Care | Payer: Medicaid Other | Attending: Emergency Medicine | Admitting: Emergency Medicine

## 2021-07-31 ENCOUNTER — Encounter (HOSPITAL_COMMUNITY): Payer: Self-pay

## 2021-07-31 DIAGNOSIS — Z5321 Procedure and treatment not carried out due to patient leaving prior to being seen by health care provider: Secondary | ICD-10-CM | POA: Diagnosis not present

## 2021-07-31 DIAGNOSIS — R531 Weakness: Secondary | ICD-10-CM | POA: Diagnosis present

## 2021-07-31 LAB — URINALYSIS, ROUTINE W REFLEX MICROSCOPIC
Bilirubin Urine: NEGATIVE
Glucose, UA: NEGATIVE mg/dL
Ketones, ur: NEGATIVE mg/dL
Nitrite: NEGATIVE
Protein, ur: NEGATIVE mg/dL
Specific Gravity, Urine: 1.01 (ref 1.005–1.030)
pH: 5 (ref 5.0–8.0)

## 2021-07-31 LAB — CBC WITH DIFFERENTIAL/PLATELET
Abs Immature Granulocytes: 0.01 10*3/uL (ref 0.00–0.07)
Basophils Absolute: 0 10*3/uL (ref 0.0–0.1)
Basophils Relative: 1 %
Eosinophils Absolute: 0.3 10*3/uL (ref 0.0–0.5)
Eosinophils Relative: 5 %
HCT: 39.7 % (ref 36.0–46.0)
Hemoglobin: 12.6 g/dL (ref 12.0–15.0)
Immature Granulocytes: 0 %
Lymphocytes Relative: 24 %
Lymphs Abs: 1.6 10*3/uL (ref 0.7–4.0)
MCH: 26.8 pg (ref 26.0–34.0)
MCHC: 31.7 g/dL (ref 30.0–36.0)
MCV: 84.3 fL (ref 80.0–100.0)
Monocytes Absolute: 0.6 10*3/uL (ref 0.1–1.0)
Monocytes Relative: 9 %
Neutro Abs: 4.1 10*3/uL (ref 1.7–7.7)
Neutrophils Relative %: 61 %
Platelets: 251 10*3/uL (ref 150–400)
RBC: 4.71 MIL/uL (ref 3.87–5.11)
RDW: 13.4 % (ref 11.5–15.5)
WBC: 6.6 10*3/uL (ref 4.0–10.5)
nRBC: 0 % (ref 0.0–0.2)

## 2021-07-31 LAB — COMPREHENSIVE METABOLIC PANEL
ALT: 20 U/L (ref 0–44)
AST: 19 U/L (ref 15–41)
Albumin: 3.9 g/dL (ref 3.5–5.0)
Alkaline Phosphatase: 47 U/L (ref 38–126)
Anion gap: 6 (ref 5–15)
BUN: 12 mg/dL (ref 6–20)
CO2: 27 mmol/L (ref 22–32)
Calcium: 9.1 mg/dL (ref 8.9–10.3)
Chloride: 105 mmol/L (ref 98–111)
Creatinine, Ser: 1.14 mg/dL — ABNORMAL HIGH (ref 0.44–1.00)
GFR, Estimated: >60 mL/min (ref >60)
Glucose, Bld: 84 mg/dL (ref 70–99)
Potassium: 3.5 mmol/L (ref 3.5–5.1)
Sodium: 138 mmol/L (ref 135–145)
Total Bilirubin: 0.5 mg/dL (ref 0.3–1.2)
Total Protein: 7.7 g/dL (ref 6.5–8.1)

## 2021-07-31 LAB — I-STAT BETA HCG BLOOD, ED (MC, WL, AP ONLY): I-stat hCG, quantitative: <5 m[IU]/mL (ref <5)

## 2021-07-31 LAB — CBG MONITORING, ED: Glucose-Capillary: 122 mg/dL — ABNORMAL HIGH (ref 70–99)

## 2021-07-31 NOTE — ED Notes (Signed)
Urine in main lab 

## 2021-07-31 NOTE — ED Triage Notes (Signed)
Pt states that she feels that her sugar was low today. Pt ate before she came and is not feeling as bad as she was earlier.

## 2021-09-11 ENCOUNTER — Emergency Department (HOSPITAL_BASED_OUTPATIENT_CLINIC_OR_DEPARTMENT_OTHER)
Admission: EM | Admit: 2021-09-11 | Discharge: 2021-09-11 | Disposition: A | Discharge status: Home / Self Care | Payer: Medicaid Other | Attending: Emergency Medicine | Admitting: Emergency Medicine

## 2021-09-11 ENCOUNTER — Encounter (HOSPITAL_BASED_OUTPATIENT_CLINIC_OR_DEPARTMENT_OTHER): Payer: Self-pay

## 2021-09-11 ENCOUNTER — Other Ambulatory Visit: Payer: Self-pay

## 2021-09-11 DIAGNOSIS — J069 Acute upper respiratory infection, unspecified: Secondary | ICD-10-CM

## 2021-09-11 DIAGNOSIS — R059 Cough, unspecified: Secondary | ICD-10-CM | POA: Diagnosis present

## 2021-09-11 MED ORDER — BENZONATATE 100 MG PO CAPS: 100.0000 mg | ORAL_CAPSULE | Freq: Three times a day (TID) | ORAL | 0 refills | Status: DC

## 2021-09-11 NOTE — ED Provider Notes (Signed)
[facility] EMERGENCY DEPT Provider Note   CSN: [id number] Arrival date & time: 09/11/21  0401     History  Chief Complaint  Patient presents with   Cough   Shortness of Breath    Diane Craig is a 36 y.o. female.  36 yo F with a chief complaints of cough and congestion.  This been going on for a couple days now.  She started with just a little bit of congestion and then has developed some subjective fever and chills over the past 48 hours.  She went to sleep last night and then woke up and felt a very severe urge to cough and feel like she is having difficulty breathing during it.  Feels a bit better now but still feels a bit off.  Has had a cramp in her side as well.  She denies nausea vomiting or diarrhea.  Denies history of asthma denies smoking history.   Cough Associated symptoms: shortness of breath   Shortness of Breath Associated symptoms: cough        Home Medications Prior to Admission medications   Medication Sig Start Date End Date Taking? Authorizing Provider  benzonatate (TESSALON) 100 MG capsule Take 1 capsule (100 mg total) by mouth every 8 (eight) hours. 09/11/21  Yes Melene Plan, DO  azelastine (ASTELIN) 0.1 % nasal spray 2 sprays each nostril twice a day as needed Patient not taking: Reported on 12/14/2020 09/25/19   Marcelyn Bruins, MD  ketoconazole (NIZORAL) 2 % shampoo Apply topically 3 (three) times a week. Patient not taking: Reported on 12/14/2020 06/14/19   [provider]  loratadine (CLARITIN) 10 MG tablet Take 10 mg by mouth daily as needed for allergies. Patient not taking: Reported on 12/14/2020    [provider]  norelgestromin-ethinyl estradiol Burr Medico) 150-35 MCG/24HR transdermal patch Place 1 patch onto the skin once a week. 12/14/20   Wyline Beady A, NP  Olopatadine HCl 0.2 % SOLN 1 drop each eye daily as needed Patient not taking: Reported on 12/14/2020 09/25/19   Marcelyn Bruins, MD       Allergies    Flagyl [metronidazole]    Review of Systems   Review of Systems  Respiratory:  Positive for cough and shortness of breath.     Physical Exam Updated Vital Signs BP (!) 134/98   Pulse 77   Resp 18   Ht 5\' 5"  (1.651 m)   Wt 83.5 kg   LMP 08/24/2021   SpO2 100%   BMI 30.62 kg/m  Physical Exam Vitals and nursing note reviewed.  Constitutional:      General: She is not in acute distress.    Appearance: She is well-developed. She is not diaphoretic.  HENT:     Head: Normocephalic and atraumatic.     Comments: Swollen turbinates, posterior nasal drip,  tm normal bilaterally.   Eyes:     Pupils: Pupils are equal, round, and reactive to light.  Cardiovascular:     Rate and Rhythm: Normal rate and regular rhythm.     Heart sounds: No murmur heard.    No friction rub. No gallop.  Pulmonary:     Effort: Pulmonary effort is normal.     Breath sounds: No wheezing or rales.  Abdominal:     General: There is no distension.     Palpations: Abdomen is soft.     Tenderness: There is no abdominal tenderness.  Musculoskeletal:        General: No tenderness.  Cervical back: Normal range of motion and neck supple.  Skin:    General: Skin is warm and dry.  Neurological:     Mental Status: She is alert and oriented to person, place, and time.  Psychiatric:        Behavior: Behavior normal.     ED Results / Procedures / Treatments   Labs (all labs ordered are listed, but only abnormal results are displayed) Labs Reviewed - No data to display  EKG None  Radiology No results found.  Procedures Procedures    Medications Ordered in ED Medications - No data to display  ED Course/ Medical Decision Making/ A&P                           Medical Decision Making Risk Prescription drug management.   36 yo F with a chief complaints of cough congestion.  Going on for about 72 hours.  She is well-appearing and nontoxic is clear lung sounds.  I do not suspect  his x-ray of the chest would be beneficial.  Most likely patient has a viral URI.  Has no bacterial source found on exam.  We will continue to treat supportively.  PCP follow-up.  4:46 AM:  I have discussed the diagnosis/risks/treatment options with the patient.  Evaluation and diagnostic testing in the emergency department does not suggest an emergent condition requiring admission or immediate intervention beyond what has been performed at this time.  They will follow up with  PCP. We also discussed returning to the ED immediately if new or worsening sx occur. We discussed the sx which are most concerning (e.g., sudden worsening pain, fever, inability to tolerate by mouth) that necessitate immediate return. Medications administered to the patient during their visit and any new prescriptions provided to the patient are listed below.  Medications given during this visit Medications - No data to display   The patient appears reasonably screen and/or stabilized for discharge and I doubt any other medical condition or other [facility] requiring further screening, evaluation, or treatment in the ED at this time prior to discharge.          Final Clinical Impression(s) / ED Diagnoses Final diagnoses:  Viral URI with cough    Rx / DC Orders ED Discharge Orders          Ordered    benzonatate (TESSALON) 100 MG capsule  Every 8 hours        09/11/21 0439              Melene Plan, DO 09/11/21 [phone]

## 2021-09-11 NOTE — ED Notes (Signed)
Pt voiced understanding of d/c instructions. Pt unable to sign at d/c due to signature pad freezing up.

## 2021-09-11 NOTE — Discharge Instructions (Signed)
Take tylenol 2 pills 4 times a day and motrin 4 pills 3 times a day.  Drink plenty of fluids.  Return for worsening shortness of breath, headache, confusion. Follow up with your family doctor.   You can try Sudafed, the medicine that you have to show your driver's license and sign your name.  This works very well but will raise your blood pressure and make your heart race.  You can also try Afrin which can have similar side effects but works right at the source and so may have less heart racing.  If you use Afrin, it can become addictive, so it is only recommended for 3 days.  

## 2021-09-11 NOTE — ED Triage Notes (Signed)
Pt presents to the ED with cough and sore throat that started on Friday. States that she woke up tonight and started feeling SHOB. Pt states that she was coughing and became really short of breath.

## 2021-10-02 ENCOUNTER — Emergency Department (HOSPITAL_COMMUNITY): Payer: Medicaid Other

## 2021-10-02 ENCOUNTER — Encounter (HOSPITAL_COMMUNITY): Payer: Self-pay | Admitting: Emergency Medicine

## 2021-10-02 ENCOUNTER — Emergency Department (HOSPITAL_COMMUNITY)
Admission: EM | Admit: 2021-10-02 | Discharge: 2021-10-02 | Disposition: A | Discharge status: Home / Self Care | Payer: Medicaid Other | Attending: Emergency Medicine | Admitting: Emergency Medicine

## 2021-10-02 ENCOUNTER — Other Ambulatory Visit: Payer: Self-pay

## 2021-10-02 DIAGNOSIS — R079 Chest pain, unspecified: Secondary | ICD-10-CM

## 2021-10-02 DIAGNOSIS — R0789 Other chest pain: Secondary | ICD-10-CM | POA: Diagnosis present

## 2021-10-02 LAB — COMPREHENSIVE METABOLIC PANEL
ALT: 21 U/L (ref 0–44)
AST: 19 U/L (ref 15–41)
Albumin: 3.9 g/dL (ref 3.5–5.0)
Alkaline Phosphatase: 51 U/L (ref 38–126)
Anion gap: 7 (ref 5–15)
BUN: 14 mg/dL (ref 6–20)
CO2: 23 mmol/L (ref 22–32)
Calcium: 8.8 mg/dL — ABNORMAL LOW (ref 8.9–10.3)
Chloride: 105 mmol/L (ref 98–111)
Creatinine, Ser: 1.02 mg/dL — ABNORMAL HIGH (ref 0.44–1.00)
GFR, Estimated: >60 mL/min (ref >60)
Glucose, Bld: 85 mg/dL (ref 70–99)
Potassium: 3.5 mmol/L (ref 3.5–5.1)
Sodium: 135 mmol/L (ref 135–145)
Total Bilirubin: 0.4 mg/dL (ref 0.3–1.2)
Total Protein: 7.7 g/dL (ref 6.5–8.1)

## 2021-10-02 LAB — CBC WITH DIFFERENTIAL/PLATELET
Abs Immature Granulocytes: 0.01 10*3/uL (ref 0.00–0.07)
Basophils Absolute: 0 10*3/uL (ref 0.0–0.1)
Basophils Relative: 0 %
Eosinophils Absolute: 0.1 10*3/uL (ref 0.0–0.5)
Eosinophils Relative: 1 %
HCT: 40.7 % (ref 36.0–46.0)
Hemoglobin: 12.6 g/dL (ref 12.0–15.0)
Immature Granulocytes: 0 %
Lymphocytes Relative: 21 %
Lymphs Abs: 1.3 10*3/uL (ref 0.7–4.0)
MCH: 26 pg (ref 26.0–34.0)
MCHC: 31 g/dL (ref 30.0–36.0)
MCV: 84.1 fL (ref 80.0–100.0)
Monocytes Absolute: 0.7 10*3/uL (ref 0.1–1.0)
Monocytes Relative: 11 %
Neutro Abs: 4.1 10*3/uL (ref 1.7–7.7)
Neutrophils Relative %: 67 %
Platelets: 264 10*3/uL (ref 150–400)
RBC: 4.84 MIL/uL (ref 3.87–5.11)
RDW: 13.5 % (ref 11.5–15.5)
WBC: 6.2 10*3/uL (ref 4.0–10.5)
nRBC: 0 % (ref 0.0–0.2)

## 2021-10-02 LAB — TROPONIN I (HIGH SENSITIVITY)
Troponin I (High Sensitivity): <2 ng/L (ref <18)
Troponin I (High Sensitivity): <2 ng/L (ref <18)

## 2021-10-02 MED ORDER — IBUPROFEN 600 MG PO TABS: 600.0000 mg | ORAL_TABLET | Freq: Four times a day (QID) | ORAL | 0 refills | Status: DC | PRN

## 2021-10-02 NOTE — ED Triage Notes (Signed)
Patient c/o chest tightness with SOB while eating lunch today.

## 2021-10-02 NOTE — ED Provider Triage Note (Signed)
Emergency Medicine Provider Triage Evaluation Note  Diane Craig , a 36 y.o. female  was evaluated in triage.  Pt complains of chest pain and shortness of breath.  Patient states that symptoms began sometime this morning.  They were worse when walking to lunch at work today.  She has sharp chest pain left-sided in nature described as chest tightness with associated shortness of breath.  She states that the chest tightness is still present but she is not very short of breath currently.  She states she was seen earlier in July for URI type symptoms but was not tested for COVID but thinks she may have positive.  She notes persistent dry cough since her symptoms in early July.  Denies fever, abdominal pain, nausea, vomiting.  Denies history of asthma/COPD.  Review of Systems  Positive: See above Negative:   Physical Exam  BP 119/73 (BP Location: Right Arm)   Pulse 79   Temp 99.1 F (37.3 C) (Oral)   Resp 18   LMP 09/18/2021 (Approximate)   SpO2 99%  Gen:   Awake, no distress   Resp:  Normal effort  MSK:   Moves extremities without difficulty  Other:  No murmurs gallops or rubs noted.  No wheeze, rhonchi, Rales noted.  Medical Decision Making  Medically screening exam initiated at 2:32 PM.  Appropriate orders placed.  Diane Craig was informed that the remainder of the evaluation will be completed by another provider, this initial triage assessment does not replace that evaluation, and the importance of remaining in the ED until their evaluation is complete.     Peter Garter, [place] 10/02/21 1434

## 2021-10-02 NOTE — Discharge Instructions (Addendum)
That your work-up today was overall negative for heart attack.  Not concern for a blood clot in your lungs at this time.  Your symptoms most likely secondary to musculoskeletal origin..  We will treat this outpatient with ibuprofen therapy.  Cardiologist information given to follow-up outpatient attrition's convenience.  Recommend reevaluation by her PCP by the end of the week.  Please not hesitate to return to the emergency department if the worrisome signs and symptoms we discussed become apparent.

## 2021-10-02 NOTE — ED Notes (Signed)
Patient approached the nurses station at shift change.  This RN overheard patient telling dayshift nurse that she needed to leave because her ride was here, and that she would check mychart for results.  Patient stated that she couldn't wait to be discharge and had to go.  Patient seen ambulating with a steady gait out of ED to exit.

## 2021-10-02 NOTE — ED Provider Notes (Signed)
[facility] DEPT Provider Note   CSN: [id number] Arrival date & time: 10/02/21  1355     History  Chief Complaint  Patient presents with   Chest Pain    Diane Craig is a 36 y.o. female.   Chest Pain  36 year old female presents emergency department with complaints of chest pain and shortness of breath.  Patient states that symptoms began sometime this morning.  They were worse when walking to lunch at work today.  She has sharp chest pain left-sided in nature described as chest tightness with associated shortness of breath.  Pain does not radiate anywhere.  It is not positional in nature.  She states that the chest tightness is still present but she is not very short of breath currently.  She states she was seen earlier in July for URI type symptoms but was not tested for COVID but thinks she may have positive.  She notes persistent dry cough since her symptoms in early July.  Denies fever, abdominal pain, nausea, vomiting, urinary/vaginal symptoms, change in bowel habits.  Denies history of smoking, hypertension, diabetes, drug use-cocaine use, hyperlipidemia. Denies history of asthma/COPD.  Past medical history significant for depression, panic disorder, anxiety, major depressive disorder  Home Medications Prior to Admission medications   Medication Sig Start Date End Date Taking? Authorizing Provider  ibuprofen (ADVIL) 600 MG tablet Take 1 tablet (600 mg total) by mouth every 6 (six) hours as needed. 10/02/21  Yes Dion Saucier A, PA  azelastine (ASTELIN) 0.1 % nasal spray 2 sprays each nostril twice a day as needed Patient not taking: Reported on 12/14/2020 09/25/19   Kennith Gain, MD  benzonatate (TESSALON) 100 MG capsule Take 1 capsule (100 mg total) by mouth every 8 (eight) hours. 09/11/21   Deno Etienne, DO  ketoconazole (NIZORAL) 2 % shampoo Apply topically 3 (three) times a week. Patient not taking: Reported on 12/14/2020 06/14/19    [provider]  loratadine (CLARITIN) 10 MG tablet Take 10 mg by mouth daily as needed for allergies. Patient not taking: Reported on 12/14/2020    [provider]  norelgestromin-ethinyl estradiol Marilu Favre) 150-35 MCG/24HR transdermal patch Place 1 patch onto the skin once a week. 12/14/20   Marny Lowenstein A, NP  Olopatadine HCl 0.2 % SOLN 1 drop each eye daily as needed Patient not taking: Reported on 12/14/2020 09/25/19   Kennith Gain, MD      Allergies    Flagyl [metronidazole]    Review of Systems   Review of Systems  Cardiovascular:  Positive for chest pain.  All other systems reviewed and are negative.   Physical Exam Updated Vital Signs BP 115/82   Pulse (!) 102   Temp 98.2 F (36.8 C)   Resp 14   LMP 09/18/2021 (Approximate)   SpO2 99%  Physical Exam Vitals and nursing note reviewed.  Constitutional:      General: She is not in acute distress.    Appearance: She is well-developed.  HENT:     Head: Normocephalic and atraumatic.  Eyes:     Conjunctiva/sclera: Conjunctivae normal.  Cardiovascular:     Rate and Rhythm: Normal rate and regular rhythm.     Heart sounds: No murmur heard. Pulmonary:     Effort: Pulmonary effort is normal. No respiratory distress.     Breath sounds: Normal breath sounds. No wheezing, rhonchi or rales.  Chest:     Comments: Patient is tender to palpation over mid to left anterior chest  wall.  No overlying skin abnormality noted.  This is the area where she said she felt pain earlier. Abdominal:     Palpations: Abdomen is soft.     Tenderness: There is no abdominal tenderness.  Musculoskeletal:        General: No swelling. Normal range of motion.     Cervical back: Normal range of motion and neck supple.     Right lower leg: No tenderness. No edema.     Left lower leg: No tenderness. No edema.  Skin:    General: Skin is warm and dry.     Capillary Refill: Capillary refill takes less than 2 seconds.   Neurological:     Mental Status: She is alert.  Psychiatric:        Mood and Affect: Mood normal.     ED Results / Procedures / Treatments   Labs (all labs ordered are listed, but only abnormal results are displayed) Labs Reviewed  COMPREHENSIVE METABOLIC PANEL - Abnormal; Notable for the following components:      Result Value   Creatinine, Ser 1.02 (*)    Calcium 8.8 (*)    All other components within normal limits  CBC WITH DIFFERENTIAL/PLATELET  TROPONIN I (HIGH SENSITIVITY)  TROPONIN I (HIGH SENSITIVITY)    EKG EKG Interpretation  Date/Time:  Monday October 02 2021 14:07:41 EDT Ventricular Rate:  80 PR Interval:  180 QRS Duration: 77 QT Interval:  363 QTC Calculation: 419 R Axis:   59 Text Interpretation: Sinus rhythm Low voltage, precordial leads No significant change since last tracing Confirmed by Alvira Monday ([id number]) on 10/02/2021 7:15:11 PM  Radiology DG Chest 2 View  Result Date: 10/02/2021 CLINICAL DATA:  Left side chest pain, shortness of breath EXAM: CHEST - 2 VIEW COMPARISON:  05/30/2019 FINDINGS: The heart size and mediastinal contours are within normal limits. Both lungs are clear. The visualized skeletal structures are unremarkable. IMPRESSION: No active cardiopulmonary disease. Electronically Signed   By: Charlett Nose M.D.   On: 10/02/2021 14:46    Procedures Procedures    Medications Ordered in ED Medications - No data to display  ED Course/ Medical Decision Making/ A&P                           Medical Decision Making Amount and/or Complexity of Data Reviewed Labs: ordered. Radiology: ordered.   This patient presents to the ED for concern of chest pain, this involves an extensive number of treatment options, and is a complaint that carries with it a high risk of complications and morbidity.  The differential diagnosis includes The emergent causes of chest pain include: Acute coronary syndrome, tamponade, pericarditis/myocarditis, aortic  dissection, pulmonary embolism, tension pneumothorax, pneumonia, and esophageal rupture. I do not believe the patient has an emergent cause of chest pain, other urgent/non-acute considerations include, but are not limited to: chronic angina, aortic stenosis, cardiomyopathy, mitral valve prolapse, pulmonary hypertension, aortic insufficiency, right ventricular hypertrophy, pleuritis, bronchitis, pneumothorax, tumor, gastroesophageal reflux disease (GERD), esophageal spasm, Mallory-Weiss syndrome, peptic ulcer disease, pancreatitis, functional gastrointestinal pain, cervical or thoracic disk disease or arthritis, shoulder arthritis, costochondritis, subacromial bursitis, anxiety or panic attack, herpes zoster, breast disorders, chest wall tumors, thoracic outlet syndrome, mediastinitis.   Co morbidities that complicate the patient evaluation  See HPI   Additional history obtained:  Additional history obtained from EMR External records from outside source obtained and reviewed including prior creatinine 1.14 from 07/31/2021   Lab Tests:  I Ordered, and personally interpreted labs.  The pertinent results include: Creatinine 1.02 which is decreased from patient's prior creatinine of 1.14 from 07/31/2021   Imaging Studies ordered:  I ordered imaging studies including chest x-ray I independently visualized and interpreted imaging which showed no acute cardiopulmonary abnormality I agree with the radiologist interpretation   Cardiac Monitoring: / EKG:  The patient was maintained on a cardiac monitor.  I personally viewed and interpreted the cardiac monitored which showed an underlying rhythm of: Sinus rhythm with nonspecific T waves changes   Consultations Obtained:  N/a   Problem List / ED Course / Critical interventions / Medication management  Chest pain Reevaluation of the patient showed that the patient improved I have reviewed the patients home medicines and have made adjustments  as needed   Social Determinants of Health:  Denies tobacco, illicit drug use.   Test / Admission - Considered:  Chest pain Vitals signs  within normal range and stable throughout visit. Laboratory/imaging studies significant for: No acute abnormalities. ACS in differential but deemed unlikely given reproducible nature of pain on physical exam as well as delta negative troponin and nonconcerning EKG. heart pathway score of 2 given 0.9 to 1.7% 30-day Mace score.  He also unlikely given clinical significance.  Wells score of 0 and PERC negative so further laboratory/imaging work-up deemed unnecessary at this time.  Patient has minimal EKG findings consistent with pericarditis but notes significant improvement with time as well as lacks positionality related pain; doubt pericarditis.  Patient's symptoms most likely secondary to musculoskeletal origin given significant reproducibility on exam.  We will treat with nonsteroidals outpatient as if pericarditis for 7 to 10 days..  Doubt dissection.  Doubt tamponade.  Doubt tension pneumo.  Doubt pneumonia.  Recommend close follow-up with PCP as well as cardiology for further management/evaluation of patient's symptoms.  Treatment plan was discussed with patient patient knowledge understanding was agreeable to said plan. Worrisome signs and symptoms were discussed with the patient, and the patient acknowledged understanding to return to the ED if noticed. Patient was stable upon discharge.         Final Clinical Impression(s) / ED Diagnoses Final diagnoses:  Chest pain, unspecified type    Rx / DC Orders ED Discharge Orders          Ordered    ibuprofen (ADVIL) 600 MG tablet  Every 6 hours PRN        10/02/21 1910              Peter Garter, [place] 10/02/21 1916    Alvira Monday, MD 10/03/21 1157

## 2021-10-10 ENCOUNTER — Emergency Department (HOSPITAL_BASED_OUTPATIENT_CLINIC_OR_DEPARTMENT_OTHER): Payer: Medicaid Other | Admitting: Radiology

## 2021-10-10 ENCOUNTER — Encounter (HOSPITAL_BASED_OUTPATIENT_CLINIC_OR_DEPARTMENT_OTHER): Payer: Self-pay | Admitting: Emergency Medicine

## 2021-10-10 ENCOUNTER — Emergency Department (HOSPITAL_BASED_OUTPATIENT_CLINIC_OR_DEPARTMENT_OTHER)
Admission: EM | Admit: 2021-10-10 | Discharge: 2021-10-11 | Disposition: A | Discharge status: Home / Self Care | Payer: Medicaid Other | Attending: Emergency Medicine | Admitting: Emergency Medicine

## 2021-10-10 ENCOUNTER — Other Ambulatory Visit: Payer: Self-pay

## 2021-10-10 DIAGNOSIS — Z20822 Contact with and (suspected) exposure to covid-19: Secondary | ICD-10-CM | POA: Insufficient documentation

## 2021-10-10 DIAGNOSIS — J209 Acute bronchitis, unspecified: Secondary | ICD-10-CM | POA: Insufficient documentation

## 2021-10-10 DIAGNOSIS — R059 Cough, unspecified: Secondary | ICD-10-CM | POA: Diagnosis present

## 2021-10-10 LAB — RESP PANEL BY RT-PCR (FLU A&B, COVID) ARPGX2
Influenza A by PCR: NEGATIVE
Influenza B by PCR: NEGATIVE
SARS Coronavirus 2 by RT PCR: NEGATIVE

## 2021-10-10 MED ORDER — ALBUTEROL SULFATE HFA 108 (90 BASE) MCG/ACT IN AERS
2.0000 | INHALATION_SPRAY | RESPIRATORY_TRACT | Status: DC | PRN
  Administered 2021-10-10: 2 via RESPIRATORY_TRACT
  Filled 2021-10-10: qty 6.7

## 2021-10-10 MED ORDER — AEROCHAMBER PLUS FLO-VU MISC
1.0000 | Freq: Once | Status: AC
  Administered 2021-10-10: 1
  Filled 2021-10-10: qty 1

## 2021-10-10 NOTE — ED Triage Notes (Signed)
States "breathing has been off all day" "Feels dehydrates" Took benadryl and mucinex today with some relief.

## 2021-10-10 NOTE — ED Provider Notes (Signed)
[facility] EMERGENC[facility] Provider Note: Diane Dell, MD, FACEP  CSN: [id number] MRN: [id number] ARRIVAL: 10/10/21 at 2203 ROOM: DB011/DB011   CHIEF COMPLAINT  URI   HISTORY OF PRESENT ILLNESS  10/10/21 10:45 PM Diane Craig is a 36 y.o. female with about a month and a half of respiratory symptoms.  Specifically she has had cough and what she describes as difficulty breathing.  She feels she is having to work extra hard to move air in and out of her lungs.  This evening she felt herself shaking but did not have associated fever.  She has been seen in the ED on 10/02/2021 and 09/11/2021.  Her chest x-ray on 10/02/2021 was unremarkable.  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   Preterm delivery without spontaneous labor 01/09/2012   Induction for Preeclampsia    Past Surgical History:  Procedure Laterality Date   CHOLECYSTECTOMY  01/2019   COLPOSCOPY  2013   NO PAST SURGERIES     WISDOM TOOTH EXTRACTION      Family History  Problem Relation Age of Onset   Drug abuse Maternal Grandmother    Hypercholesterolemia Mother    Anxiety disorder Neg Hx    Bipolar disorder Neg Hx    Depression Neg Hx    Alcohol abuse Neg Hx    Suicidality Neg Hx     Social History   Tobacco Use   Smoking status: Never   Smokeless tobacco: Never  Vaping Use   Vaping Use: Never used  Substance Use Topics   Alcohol use: Yes    Alcohol/week: 1.0 standard drink of alcohol    Types: 1 Glasses of wine per week    Comment: Occas   Drug use: No    Prior to Admission medications   Medication Sig Start Date End Date Taking? Authorizing Provider  azelastine (ASTELIN) 0.1 % nasal spray 2 sprays each nostril twice a day as needed Patient not taking: Reported on 12/14/2020 09/25/19   Marcelyn Bruins, MD  benzonatate (TESSALON) 100 MG capsule Take 1 capsule (100 mg total) by mouth every 8 (eight) hours. 09/11/21   Melene Plan, DO  ibuprofen (ADVIL) 600 MG tablet Take 1 tablet (600 mg total) by mouth every 6 (six) hours as needed. 10/02/21   Peter Garter, PA  ketoconazole (NIZORAL) 2 % shampoo Apply topically 3 (three) times a week. Patient not taking: Reported on 12/14/2020 06/14/19   [provider]  loratadine (CLARITIN) 10 MG tablet Take 10 mg by mouth daily as needed for allergies. Patient not taking: Reported on 12/14/2020    [provider]  norelgestromin-ethinyl estradiol Burr Medico) 150-35 MCG/24HR transdermal patch Place 1 patch onto the skin once a week. 12/14/20   Wyline Beady A, NP  Olopatadine HCl 0.2 % SOLN 1 drop each eye daily as needed Patient not taking: Reported on 12/14/2020 09/25/19   Marcelyn Bruins, MD    Allergies Flagyl [metronidazole]   REVIEW OF SYSTEMS  Negative except as noted here or in the History of Present Illness.   PHYSICAL EXAMINATION  Initial Vital Signs Blood pressure 120/86, pulse 74, temperature 98.4 F (36.9 C), temperature source Oral, resp. rate 18, last menstrual period 09/18/2021, SpO2 100 %.  Examination  General: Well-developed, well-nourished female in no acute distress; appearance consistent with age of record HENT: normocephalic; atraumatic Eyes: Normal appearance Neck: supple Heart: regular rate and rhythm Lungs: clear to auscultation bilaterally Abdomen: soft; nondistended; nontender; bowel sounds present Extremities: No deformity; full range of motion Neurologic: Awake, alert and oriented; motor function intact in all extremities and symmetric; no facial droop Skin: Warm and dry Psychiatric: Normal mood and affect   RESULTS  Summary of this visit's results, reviewed and interpreted by myself:   EKG Interpretation  Date/Time:    Ventricular Rate:    PR Interval:    QRS Duration:    QT Interval:    QTC Calculation:   R Axis:     Text Interpretation:         Laboratory Studies: Results for orders placed or performed during the hospital encounter of 10/10/21 (from the past 24 hour(s))  Resp Panel by RT-PCR (Flu A&B, Covid) Anterior Nasal Swab     Status: None   Collection Time: 10/10/21 10:15 PM   Specimen: Anterior Nasal Swab  Result Value Ref Range   SARS Coronavirus 2 by RT PCR NEGATIVE NEGATIVE   Influenza A by PCR NEGATIVE NEGATIVE   Influenza B by PCR NEGATIVE NEGATIVE   Imaging Studies: No results found.  ED COURSE and MDM  Nursing notes, initial and subsequent vitals signs, including pulse oximetry, reviewed and interpreted by myself.  Vitals:   10/10/21 2209 10/10/21 2210  BP:  120/86  Pulse:  74  Resp:  18  Temp:  98.4 F (36.9 C)  TempSrc:  Oral  SpO2: 100% 100%   Medications  albuterol (VENTOLIN HFA) 108 (90 Base) MCG/ACT inhaler 2 puff (has no administration in time range)  aerochamber plus with mask device 1 each (has no administration in time range)      PROCEDURES  Procedures   ED DIAGNOSES  No diagnosis found.

## 2021-10-11 NOTE — ED Provider Notes (Incomplete)
[facility] Provider Note: Diane Dell, MD, FACEP  CSN: [id number] MRN: [id number] ARRIVAL: 10/10/21 at 2203 ROOM: DB011/DB011   CHIEF COMPLAINT  URI   HISTORY OF PRESENT ILLNESS  10/10/21 10:45 PM Diane Craig is a 36 y.o. female with about a month and a half of respiratory symptoms.  Specifically she has had cough and what she describes as difficulty breathing.  She feels she is having to work extra hard to move air in and out of her lungs.  This evening she felt herself shaking but did not have associated fever.  She has been seen in the ED on 10/02/2021 and 09/11/2021.  Her chest x-ray on 10/02/2021 was unremarkable.  She does not have an inhaler   Past Medical History:  Diagnosis Date  . Abnormal Pap smear 2009   COLPO  LAST PAP 03/2011  . Anxiety   . Depression   . 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 Preterm delivery without spontaneous labor 01/09/2012   Induction for Preeclampsia    Past Surgical History:  Procedure Laterality Date  . CHOLECYSTECTOMY  01/2019  . COLPOSCOPY  2013  . NO PAST SURGERIES    . WISDOM TOOTH EXTRACTION      Family History  Problem Relation Age of Onset  . Drug abuse Maternal Grandmother   . Hypercholesterolemia Mother   . Anxiety disorder Neg Hx   . Bipolar disorder Neg Hx   . Depression Neg Hx   . Alcohol abuse Neg Hx   . Suicidality Neg Hx     Social History   Tobacco Use  . Smoking status: Never  . Smokeless tobacco: Never  Vaping Use  . Vaping Use: Never used  Substance Use Topics  . Alcohol use: Yes    Alcohol/week: 1.0 standard drink of alcohol    Types: 1 Glasses of wine per week    Comment: Occas  . Drug use: No    Prior to Admission medications   Medication Sig Start Date End Date Taking? Authorizing Provider  azelastine (ASTELIN) 0.1 % nasal spray 2 sprays each nostril twice a day as needed Patient not taking: Reported on 12/14/2020  09/25/19   Marcelyn Bruins, MD  benzonatate (TESSALON) 100 MG capsule Take 1 capsule (100 mg total) by mouth every 8 (eight) hours. 09/11/21   Melene Plan, DO  ibuprofen (ADVIL) 600 MG tablet Take 1 tablet (600 mg total) by mouth every 6 (six) hours as needed. 10/02/21   Peter Garter, PA  ketoconazole (NIZORAL) 2 % shampoo Apply topically 3 (three) times a week. Patient not taking: Reported on 12/14/2020 06/14/19   [provider]  loratadine (CLARITIN) 10 MG tablet Take 10 mg by mouth daily as needed for allergies. Patient not taking: Reported on 12/14/2020    [provider]  norelgestromin-ethinyl estradiol Burr Medico) 150-35 MCG/24HR transdermal patch Place 1 patch onto the skin once a week. 12/14/20   Wyline Beady A, NP  Olopatadine HCl 0.2 % SOLN 1 drop each eye daily as needed Patient not taking: Reported on 12/14/2020 09/25/19   Marcelyn Bruins, MD    Allergies Flagyl [metronidazole]   REVIEW OF SYSTEMS  Negative except as noted here or in the History of Present Illness.   PHYSICAL EXAMINATION  Initial Vital Signs Blood pressure 120/86, pulse 74, temperature 98.4 F (36.9 C), temperature source Oral, resp. rate 18, last menstrual period 09/18/2021, SpO2 100 %.  Examination  General: Well-developed, well-nourished female in no acute distress; appearance consistent with age of record HENT: normocephalic; atraumatic Eyes: Normal appearance Neck: supple Heart: regular rate and rhythm Lungs: clear to auscultation bilaterally Abdomen: soft; nondistended; nontender; bowel sounds present Extremities: No deformity; full range of motion Neurologic: Awake, alert and oriented; motor function intact in all extremities and symmetric; no facial droop Skin: Warm and dry Psychiatric: Normal mood and affect   RESULTS  Summary of this visit's results, reviewed and interpreted by myself:   EKG Interpretation  Date/Time:    Ventricular Rate:    PR  Interval:    QRS Duration:   QT Interval:    QTC Calculation:   R Axis:     Text Interpretation:         Laboratory Studies: Results for orders placed or performed during the hospital encounter of 10/10/21 (from the past 24 hour(s))  Resp Panel by RT-PCR (Flu A&B, Covid) Anterior Nasal Swab     Status: None   Collection Time: 10/10/21 10:15 PM   Specimen: Anterior Nasal Swab  Result Value Ref Range   SARS Coronavirus 2 by RT PCR NEGATIVE NEGATIVE   Influenza A by PCR NEGATIVE NEGATIVE   Influenza B by PCR NEGATIVE NEGATIVE   Imaging Studies: DG Chest 2 View  Result Date: 10/10/2021 CLINICAL DATA:  Shortness of breath and cough EXAM: CHEST - 2 VIEW COMPARISON:  10/02/2021 FINDINGS: The heart size and mediastinal contours are within normal limits. Both lungs are clear. The visualized skeletal structures are unremarkable. IMPRESSION: No active cardiopulmonary disease. Electronically Signed   By: Jasmine Pang M.D.   On: 10/10/2021 23:54    ED COURSE and MDM  Nursing notes, initial and subsequent vitals signs, including pulse oximetry, reviewed and interpreted by myself.  Vitals:   10/10/21 2209 10/10/21 2210 10/10/21 2304  BP:  120/86   Pulse:  74   Resp:  18   Temp:  98.4 F (36.9 C)   TempSrc:  Oral   SpO2: 100% 100% 100%   Medications  albuterol (VENTOLIN HFA) 108 (90 Base) MCG/ACT inhaler 2 puff (2 puffs Inhalation Given 10/10/21 2304)  aerochamber plus with mask device 1 each (1 each Other Given 10/10/21 2304)   12:01 AM Patient feels somewhat better after albuterol treatment.  She was given an albuterol inhaler and instructed in its use.  Her chest x-ray is unremarkable and she is negative for COVID and influenza.  She may have contracted COVID about 6 weeks ago and is suffering the effects of long COVID.  I do not believe any additional medications such as steroids are indicated at this time as her lungs do sound clear.   PROCEDURES  Procedures   ED DIAGNOSES  No  diagnosis found.

## 2021-10-29 ENCOUNTER — Emergency Department (HOSPITAL_BASED_OUTPATIENT_CLINIC_OR_DEPARTMENT_OTHER)
Admission: EM | Admit: 2021-10-29 | Discharge: 2021-10-29 | Disposition: A | Discharge status: Home / Self Care | Payer: Medicaid Other | Attending: Emergency Medicine | Admitting: Emergency Medicine

## 2021-10-29 ENCOUNTER — Encounter (HOSPITAL_BASED_OUTPATIENT_CLINIC_OR_DEPARTMENT_OTHER): Payer: Self-pay

## 2021-10-29 ENCOUNTER — Other Ambulatory Visit: Payer: Self-pay

## 2021-10-29 ENCOUNTER — Emergency Department (HOSPITAL_BASED_OUTPATIENT_CLINIC_OR_DEPARTMENT_OTHER): Payer: Medicaid Other

## 2021-10-29 DIAGNOSIS — Z20822 Contact with and (suspected) exposure to covid-19: Secondary | ICD-10-CM | POA: Diagnosis not present

## 2021-10-29 DIAGNOSIS — R0989 Other specified symptoms and signs involving the circulatory and respiratory systems: Secondary | ICD-10-CM | POA: Diagnosis not present

## 2021-10-29 DIAGNOSIS — R509 Fever, unspecified: Secondary | ICD-10-CM

## 2021-10-29 LAB — CBC WITH DIFFERENTIAL/PLATELET
Abs Immature Granulocytes: 0.03 10*3/uL (ref 0.00–0.07)
Basophils Absolute: 0 10*3/uL (ref 0.0–0.1)
Basophils Relative: 0 %
Eosinophils Absolute: 0 10*3/uL (ref 0.0–0.5)
Eosinophils Relative: 0 %
HCT: 38.9 % (ref 36.0–46.0)
Hemoglobin: 12.4 g/dL (ref 12.0–15.0)
Immature Granulocytes: 1 %
Lymphocytes Relative: 8 %
Lymphs Abs: 0.5 10*3/uL — ABNORMAL LOW (ref 0.7–4.0)
MCH: 25.8 pg — ABNORMAL LOW (ref 26.0–34.0)
MCHC: 31.9 g/dL (ref 30.0–36.0)
MCV: 81 fL (ref 80.0–100.0)
Monocytes Absolute: 1.1 10*3/uL — ABNORMAL HIGH (ref 0.1–1.0)
Monocytes Relative: 19 %
Neutro Abs: 4.3 10*3/uL (ref 1.7–7.7)
Neutrophils Relative %: 72 %
Platelets: 244 10*3/uL (ref 150–400)
RBC: 4.8 MIL/uL (ref 3.87–5.11)
RDW: 13.6 % (ref 11.5–15.5)
WBC: 6 10*3/uL (ref 4.0–10.5)
nRBC: 0 % (ref 0.0–0.2)

## 2021-10-29 LAB — URINALYSIS, ROUTINE W REFLEX MICROSCOPIC
Bilirubin Urine: NEGATIVE
Glucose, UA: NEGATIVE mg/dL
Ketones, ur: NEGATIVE mg/dL
Leukocytes,Ua: NEGATIVE
Nitrite: NEGATIVE
Protein, ur: NEGATIVE mg/dL
Specific Gravity, Urine: 1.014 (ref 1.005–1.030)
pH: 5.5 (ref 5.0–8.0)

## 2021-10-29 LAB — COMPREHENSIVE METABOLIC PANEL
ALT: 26 U/L (ref 0–44)
AST: 16 U/L (ref 15–41)
Albumin: 4 g/dL (ref 3.5–5.0)
Alkaline Phosphatase: 50 U/L (ref 38–126)
Anion gap: 8 (ref 5–15)
BUN: 11 mg/dL (ref 6–20)
CO2: 23 mmol/L (ref 22–32)
Calcium: 9.1 mg/dL (ref 8.9–10.3)
Chloride: 102 mmol/L (ref 98–111)
Creatinine, Ser: 0.98 mg/dL (ref 0.44–1.00)
GFR, Estimated: >60 mL/min (ref >60)
Glucose, Bld: 102 mg/dL — ABNORMAL HIGH (ref 70–99)
Potassium: 3.7 mmol/L (ref 3.5–5.1)
Sodium: 133 mmol/L — ABNORMAL LOW (ref 135–145)
Total Bilirubin: 0.3 mg/dL (ref 0.3–1.2)
Total Protein: 7.2 g/dL (ref 6.5–8.1)

## 2021-10-29 LAB — RESP PANEL BY RT-PCR (FLU A&B, COVID) ARPGX2
Influenza A by PCR: NEGATIVE
Influenza B by PCR: NEGATIVE
SARS Coronavirus 2 by RT PCR: NEGATIVE

## 2021-10-29 LAB — SEDIMENTATION RATE: Sed Rate: 17 mm/hr (ref 0–22)

## 2021-10-29 LAB — GROUP A STREP BY PCR: Group A Strep by PCR: NOT DETECTED

## 2021-10-29 LAB — PREGNANCY, URINE: Preg Test, Ur: NEGATIVE

## 2021-10-29 LAB — LACTIC ACID, PLASMA: Lactic Acid, Venous: 0.6 mmol/L (ref 0.5–1.9)

## 2021-10-29 MED ORDER — ACETAMINOPHEN 325 MG PO TABS
650.0000 mg | ORAL_TABLET | Freq: Once | ORAL | Status: AC | PRN
  Administered 2021-10-29: 650 mg via ORAL
  Filled 2021-10-29: qty 2

## 2021-10-29 NOTE — ED Provider Notes (Signed)
[facility] EMERGENCY DEPT Provider Note   CSN: [id number] Arrival date & time: 10/29/21  0202     History  Chief Complaint  Patient presents with   Fever    Diane Craig is a 36 y.o. female.  Patient is a 36 year old female presenting with a 1 month history of feeling generally unwell.  She has been seen for chest pain and various other complaints, but no cause identified.  Over the past 2 weeks, she reports persistent fevers, chills, and feeling generally unwell.  She describes some scratchy throat, but denies difficulty breathing or swallowing.  She denies any abdominal pain.  She is concerned about the duration of this fever and is wondering about autoimmune conditions.  She has been taking DayQuil and Motrin at home with little relief.  The history is provided by the patient.       Home Medications Prior to Admission medications   Medication Sig Start Date End Date Taking? Authorizing Provider  benzonatate (TESSALON) 100 MG capsule Take 1 capsule (100 mg total) by mouth every 8 (eight) hours. 09/11/21   Melene Plan, DO  ibuprofen (ADVIL) 600 MG tablet Take 1 tablet (600 mg total) by mouth every 6 (six) hours as needed. 10/02/21   Peter Garter, PA  norelgestromin-ethinyl estradiol [employer]) 150-35 MCG/24HR transdermal patch Place 1 patch onto the skin once a week. 12/14/20   [name], NP      Allergies    Flagyl [metronidazole]    Review of Systems   Review of Systems  All other systems reviewed and are negative.   Physical Exam Updated Vital Signs BP (!) 128/90 (BP Location: Right Arm)   Pulse (!) 117   Temp (!) 102.3 F (39.1 C) (Oral)   Resp 15   Ht 5\' 5"  (1.651 m)   Wt 82.6 kg   LMP 10/10/2021 (Approximate)   SpO2 99%   BMI 30.29 kg/m  Physical Exam Vitals and nursing note reviewed.  Constitutional:      General: She is not in acute distress.    Appearance: She is well-developed. She is not diaphoretic.  HENT:     Head:  Normocephalic and atraumatic.     Mouth/Throat:     Mouth: Mucous membranes are moist.     Pharynx: No oropharyngeal exudate or posterior oropharyngeal erythema.  Cardiovascular:     Rate and Rhythm: Normal rate and regular rhythm.     Heart sounds: No murmur heard.    No friction rub. No gallop.  Pulmonary:     Effort: Pulmonary effort is normal. No respiratory distress.     Breath sounds: Normal breath sounds. No wheezing.  Abdominal:     General: Bowel sounds are normal. There is no distension.     Palpations: Abdomen is soft.     Tenderness: There is no abdominal tenderness.  Musculoskeletal:        General: Normal range of motion.     Cervical back: Normal range of motion and neck supple. No rigidity or tenderness.  Lymphadenopathy:     Cervical: No cervical adenopathy.  Skin:    General: Skin is warm and dry.  Neurological:     General: No focal deficit present.     Mental Status: She is alert and oriented to person, place, and time.     ED Results / Procedures / Treatments   Labs (all labs ordered are listed, but only abnormal results are displayed) Labs Reviewed  RESP PANEL BY RT-PCR (FLU  A&B, COVID) ARPGX2  GROUP A STREP BY PCR    EKG None  Radiology No results found.  Procedures Procedures    Medications Ordered in ED Medications  acetaminophen (TYLENOL) tablet 650 mg (650 mg Oral Given 10/29/21 0219)    ED Course/ Medical Decision Making/ A&P  Patient is a 36 year old female with no significant past medical history.  She presents today with complaints of fever.  She has been feeling poorly for the past month and running fevers intermittently for the past 2 weeks.  She does describe some sore throat and body aches, but denies any specific complaints otherwise.  Patient's work-up shows no leukocytosis, negative urinalysis, normal lactate, and negative strep and COVID/flu/RSV tests.  I am uncertain as to the etiology of her fever, but patient was  concerned about autoimmune disease.  I did add on a sed rate and several other studies to further evaluate for this.  These are all pending.  At this point, patient seems appropriate for discharge with rotating Tylenol/Motrin and follow-up with primary doctor.  Final Clinical Impression(s) / ED Diagnoses Final diagnoses:  None    Rx / DC Orders ED Discharge Orders     None         Geoffery Lyons, MD 10/29/21 [phone]

## 2021-10-29 NOTE — ED Triage Notes (Signed)
Patient states she has a fever sporadically for 2 weeks. Patient has been treating fever at home with dayquil and motrin. Patient also states she has not checked her temperature at home. Patient also reports sore throat, ear ache, fatigue and body aches.

## 2021-10-29 NOTE — ED Notes (Signed)
Pt cannot urinate at this time, but she has UA cup. Pt given a cup of water. Temp has come down.

## 2021-10-29 NOTE — Discharge Instructions (Signed)
We will call you if your cultures indicate you require further treatment or need to take additional action.  Continue rotating Tylenol 1000 mg with ibuprofen 600 mg every 4 hours as needed for fever.  Drink plenty of fluids and get plenty of rest.  Follow-up with your primary doctor to discuss the results of your pending laboratory studies.

## 2021-10-30 LAB — URINE CULTURE: Culture: NO GROWTH

## 2021-10-31 LAB — RHEUMATOID FACTOR: Rheumatoid fact SerPl-aCnc: <10 IU/mL (ref <14.0)

## 2021-10-31 LAB — LYME DISEASE SEROLOGY W/REFLEX: Lyme Total Antibody EIA: NEGATIVE

## 2021-10-31 LAB — ANA: Anti Nuclear Antibody (ANA): NEGATIVE

## 2021-11-03 LAB — CULTURE, BLOOD (ROUTINE X 2)
Culture: NO GROWTH
Culture: NO GROWTH
Special Requests: NORMAL
Special Requests: NORMAL

## 2021-12-21 ENCOUNTER — Ambulatory Visit: Payer: Medicaid Other | Admitting: Nurse Practitioner

## 2021-12-26 ENCOUNTER — Ambulatory Visit (INDEPENDENT_AMBULATORY_CARE_PROVIDER_SITE_OTHER): Payer: BLUE CROSS/BLUE SHIELD | Admitting: Nurse Practitioner

## 2021-12-26 ENCOUNTER — Encounter: Payer: Self-pay | Admitting: Nurse Practitioner

## 2021-12-26 VITALS — BP 120/82 | HR 70 | Resp 12 | Ht 65.0 in | Wt 184.0 lb

## 2021-12-26 DIAGNOSIS — Z01419 Encounter for gynecological examination (general) (routine) without abnormal findings: Secondary | ICD-10-CM

## 2021-12-26 DIAGNOSIS — Z113 Encounter for screening for infections with a predominantly sexual mode of transmission: Secondary | ICD-10-CM | POA: Diagnosis not present

## 2021-12-26 NOTE — Progress Notes (Signed)
   Diane Craig 24-Aug-1985 [id number]   History:  36 y.o. G1P0101 presents for annual exam. Monthly cycles. Stopped patches, tracking cycles with app. 2013 colposcopy, subsequent paps normal.   Gynecologic History Patient's last menstrual period was 12/08/2021. Period Cycle (Days): 28 Period Duration (Days): 3-4 Period Pattern: Regular Menstrual Flow: Light Menstrual Control: Maxi pad Menstrual Control Change Freq (Hours): changes pad once a day Dysmenorrhea: (!) Mild Dysmenorrhea Symptoms: Cramping, Headache Contraception/Family planning: rhythm method Sexually active: Yes, would like STD screening  Health Maintenance Last Pap: 12/17/2017. Results were: Normal, 5-year repeat Last mammogram: Not indicated Last colonoscopy: Not indicated Last Dexa: Not indicated  Past medical history, past surgical history, family history and social history were all reviewed and documented in the EPIC chart. 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   ROS:  A ROS was performed and pertinent positives and negatives are included.  Exam:  Vitals:   12/26/21 1552  BP: 120/82  Pulse: 70  Resp: 12  Weight: 184 lb (83.5 kg)  Height: 5\' 5"  (1.651 m)    Body mass index is 30.62 kg/m.  General appearance:  Normal Thyroid:  Symmetrical, normal in size, without palpable masses or nodularity. Respiratory  Auscultation:  Clear without wheezing or rhonchi Cardiovascular  Auscultation:  Regular rate, without rubs, murmurs or gallops  Edema/varicosities:  Not grossly evident Abdominal  Soft,nontender, without masses, guarding or rebound.  Liver/spleen:  No organomegaly noted  Hernia:  None appreciated  Skin  Inspection:  Grossly normal Breasts: Examined lying and sitting.   Right: Without masses, retractions, nipple discharge or axillary adenopathy.   Left: Without masses, retractions, nipple discharge or axillary adenopathy. Genitourinary   Inguinal/mons:  Normal without inguinal  adenopathy  External genitalia:  Normal appearing vulva with no masses, tenderness, or lesions  BUS/Urethra/Skene's glands:  Normal  Vagina:  Normal appearing with normal color and discharge, no lesions  Cervix:  Normal appearing without discharge or lesions  Uterus:  Normal in size, shape and contour.  Midline and mobile, nontender  Adnexa/parametria:     Rt: Normal in size, without masses or tenderness.   Lt: Normal in size, without masses or tenderness.  Anus and perineum: Normal  Patient informed chaperone available to be present for breast and pelvic exam. Patient has requested no chaperone to be present. Patient has been advised what will be completed during breast and pelvic exam.   Assessment/Plan:  36 y.o. G1P0101 for annual exam.   Well female exam with routine gynecological exam - Education provided on SBEs, importance of preventative screenings, current guidelines, high calcium diet, regular exercise, and multivitamin daily. Labs with PCP.  Screen for STD (sexually transmitted disease) - Plan: SURESWAB CT/NG/T. vaginalis, RPR, HIV Antibody (routine testing w rflx)  Screening for cervical cancer - 2013 colpo, negative per patient. Subsequent paps normal. Will repeat at 5-year interval per guidelines.   Return in 1 year for annual.      Omega, 4:14 PM 12/26/2021

## 2021-12-27 LAB — SURESWAB CT/NG/T. VAGINALIS
C. trachomatis RNA, TMA: NOT DETECTED
N. gonorrhoeae RNA, TMA: NOT DETECTED
Trichomonas vaginalis RNA: NOT DETECTED

## 2021-12-27 LAB — RPR: RPR Ser Ql: NONREACTIVE

## 2021-12-27 LAB — HIV ANTIBODY (ROUTINE TESTING W REFLEX): HIV 1&2 Ab, 4th Generation: NONREACTIVE

## 2022-03-27 ENCOUNTER — Ambulatory Visit: Payer: BLUE CROSS/BLUE SHIELD | Admitting: Nurse Practitioner

## 2022-03-27 ENCOUNTER — Encounter: Payer: Self-pay | Admitting: Nurse Practitioner

## 2022-03-27 VITALS — BP 118/82 | HR 74

## 2022-03-27 DIAGNOSIS — N926 Irregular menstruation, unspecified: Secondary | ICD-10-CM | POA: Diagnosis not present

## 2022-03-27 DIAGNOSIS — R1030 Lower abdominal pain, unspecified: Secondary | ICD-10-CM | POA: Diagnosis not present

## 2022-03-27 DIAGNOSIS — Z113 Encounter for screening for infections with a predominantly sexual mode of transmission: Secondary | ICD-10-CM

## 2022-03-27 LAB — PREGNANCY, URINE: Preg Test, Ur: NEGATIVE

## 2022-03-27 NOTE — Progress Notes (Signed)
   Acute Office Visit  Subjective:    Patient ID: Diane Craig, female    DOB: 1985-07-27, 37 y.o.   MRN: [id number]   HPI 37 y.o. presents today for irregular menses since 02/2022. Menses came 2 weeks early in December 02/25/22 and then had some spotting ~5 days before menses started 03/22/22. Stopped patches months ago. Cycles have been normal until December Reports visiting pregnancy van yesterday and had faint positive UPT. Not preventing pregnancy but not actively trying to conceive. Denies vaginal itching, discharge or odor. Also complains of lower abdominal pain that is sharp and intermittent. Denies GI symptoms.    Review of Systems  Constitutional: Negative.   Gastrointestinal:  Positive for abdominal distention (Bloating) and abdominal pain.  Genitourinary:  Positive for menstrual problem.       Objective:    Physical Exam Constitutional:      Appearance: Normal appearance.  Abdominal:     Tenderness: There is no abdominal tenderness. There is no guarding or rebound.  Genitourinary:    General: Normal vulva.     Vagina: Normal.     Cervix: Normal.     Uterus: Normal.      BP 118/82   Pulse 74   LMP 03/22/2022 (Approximate)   SpO2 98%  Wt Readings from Last 3 Encounters:  12/26/21 184 lb (83.5 kg)  10/29/21 182 lb (82.6 kg)  09/11/21 184 lb (83.5 kg)        Patient informed chaperone available to be present for breast and/or pelvic exam. Patient has requested no chaperone to be present. Patient has been advised what will be completed during breast and pelvic exam.   UPT negative  Assessment & Plan:   Problem List Items Addressed This Visit   None Visit Diagnoses     Irregular bleeding    -  Primary   Relevant Orders   Pregnancy, urine   hCG, quantitative, pregnancy   Lower abdominal pain       Relevant Orders   hCG, quantitative, pregnancy   Screening examination for STD (sexually transmitted disease)       Relevant Orders   SureSwab Advanced  Vaginitis Plus,TMA   RPR   HIV Antibody (routine testing w rflx)      Plan: UPT negative. Will check serum hCG. STD and vaginitis panel pending.      Tamela Gammon DNP, 4:03 PM 03/27/2022

## 2022-03-28 ENCOUNTER — Other Ambulatory Visit: Payer: Self-pay | Admitting: Nurse Practitioner

## 2022-03-28 DIAGNOSIS — R7989 Other specified abnormal findings of blood chemistry: Secondary | ICD-10-CM

## 2022-03-28 DIAGNOSIS — N939 Abnormal uterine and vaginal bleeding, unspecified: Secondary | ICD-10-CM

## 2022-03-28 LAB — SURESWAB® ADVANCED VAGINITIS PLUS,TMA
C. trachomatis RNA, TMA: NOT DETECTED
CANDIDA SPECIES: NOT DETECTED
Candida glabrata: NOT DETECTED
N. gonorrhoeae RNA, TMA: NOT DETECTED
SURESWAB(R) ADV BACTERIAL VAGINOSIS(BV),TMA: POSITIVE — AB
TRICHOMONAS VAGINALIS (TV),TMA: NOT DETECTED

## 2022-03-28 LAB — HIV ANTIBODY (ROUTINE TESTING W REFLEX): HIV 1&2 Ab, 4th Generation: NONREACTIVE

## 2022-03-28 LAB — HCG, QUANTITATIVE, PREGNANCY: HCG, Total, QN: 65 m[IU]/mL

## 2022-03-28 LAB — RPR: RPR Ser Ql: NONREACTIVE

## 2022-03-29 ENCOUNTER — Encounter: Payer: Self-pay | Admitting: Nurse Practitioner

## 2022-03-29 ENCOUNTER — Other Ambulatory Visit: Payer: Medicaid Other

## 2022-03-29 ENCOUNTER — Other Ambulatory Visit: Payer: Self-pay | Admitting: *Deleted

## 2022-03-29 DIAGNOSIS — N939 Abnormal uterine and vaginal bleeding, unspecified: Secondary | ICD-10-CM

## 2022-03-29 DIAGNOSIS — R7989 Other specified abnormal findings of blood chemistry: Secondary | ICD-10-CM

## 2022-03-29 MED ORDER — CLINDAMYCIN PHOSPHATE 2 % VA CREA: 1.0000 | TOPICAL_CREAM | Freq: Every day | VAGINAL | 0 refills | Status: AC

## 2022-03-29 NOTE — Telephone Encounter (Signed)
See telephone note 03/29/2022 

## 2022-03-30 LAB — HCG, QUANTITATIVE, PREGNANCY: HCG, Total, QN: 86 m[IU]/mL

## 2022-04-02 ENCOUNTER — Other Ambulatory Visit: Payer: Self-pay | Admitting: Nurse Practitioner

## 2022-04-02 DIAGNOSIS — O280 Abnormal hematological finding on antenatal screening of mother: Secondary | ICD-10-CM

## 2022-04-03 ENCOUNTER — Other Ambulatory Visit: Payer: Medicaid Other

## 2022-04-03 DIAGNOSIS — O280 Abnormal hematological finding on antenatal screening of mother: Secondary | ICD-10-CM

## 2022-04-04 LAB — HCG, QUANTITATIVE, PREGNANCY: HCG, Total, QN: 394 m[IU]/mL

## 2022-12-31 ENCOUNTER — Ambulatory Visit: Payer: Medicaid Other | Admitting: Nurse Practitioner

## 2022-12-31 NOTE — Progress Notes (Deleted)
   Diane Craig [birth year] [id number]   History:  37 y.o. G1P0101 presents for annual exam. Monthly cycles. Stopped patches, tracking cycles with app. 2013 colposcopy, subsequent paps normal.   Gynecologic History No LMP recorded.   Contraception/Family planning: rhythm method Sexually active: Yes  Health Maintenance Last Pap: 12/17/2017. Results were: Normal neg HPV, 5-year repeat Last mammogram: Not indicated Last colonoscopy: Not indicated Last Dexa: Not indicated  Past medical history, past surgical history, family history and social history were all reviewed and documented in the EPIC chart. 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   ROS:  A ROS was performed and pertinent positives and negatives are included.  Exam:  There were no vitals filed for this visit.   There is no height or weight on file to calculate BMI.  General appearance:  Normal Thyroid:  Symmetrical, normal in size, without palpable masses or nodularity. Respiratory  Auscultation:  Clear without wheezing or rhonchi Cardiovascular  Auscultation:  Regular rate, without rubs, murmurs or gallops  Edema/varicosities:  Not grossly evident Abdominal  Soft,nontender, without masses, guarding or rebound.  Liver/spleen:  No organomegaly noted  Hernia:  None appreciated  Skin  Inspection:  Grossly normal Breasts: Examined lying and sitting.   Right: Without masses, retractions, nipple discharge or axillary adenopathy.   Left: Without masses, retractions, nipple discharge or axillary adenopathy. Genitourinary   Inguinal/mons:  Normal without inguinal adenopathy  External genitalia:  Normal appearing vulva with no masses, tenderness, or lesions  BUS/Urethra/Skene's glands:  Normal  Vagina:  Normal appearing with normal color and discharge, no lesions  Cervix:  Normal appearing without discharge or lesions  Uterus:  Normal in size, shape and contour.  Midline and mobile,  nontender  Adnexa/parametria:     Rt: Normal in size, without masses or tenderness.   Lt: Normal in size, without masses or tenderness.  Anus and perineum: Normal  Patient informed chaperone available to be present for breast and pelvic exam. Patient has requested no chaperone to be present. Patient has been advised what will be completed during breast and pelvic exam.   Assessment/Plan:  37 y.o. G1P0101 for annual exam.   Well female exam with routine gynecological exam - Education provided on SBEs, importance of preventative screenings, current guidelines, high calcium diet, regular exercise, and multivitamin daily. Labs with PCP.  Screen for STD (sexually transmitted disease) - Plan: SURESWAB CT/NG/T. vaginalis, RPR, HIV Antibody (routine testing w rflx)  Screening for cervical cancer - 2013 colpo, negative per patient. Subsequent paps normal. Will repeat at 5-year interval per guidelines.   Return in 1 year for annual.      [name] DNP, 3:03 PM 12/31/2022

## 2023-02-25 ENCOUNTER — Encounter (HOSPITAL_COMMUNITY): Payer: Self-pay | Admitting: *Deleted

## 2023-02-25 ENCOUNTER — Other Ambulatory Visit: Payer: Self-pay

## 2023-02-25 ENCOUNTER — Emergency Department (HOSPITAL_COMMUNITY)
Admission: EM | Admit: 2023-02-25 | Discharge: 2023-02-25 | Disposition: A | Discharge status: Home / Self Care | Payer: Medicaid Other | Attending: Emergency Medicine | Admitting: Emergency Medicine

## 2023-02-25 DIAGNOSIS — N939 Abnormal uterine and vaginal bleeding, unspecified: Secondary | ICD-10-CM | POA: Insufficient documentation

## 2023-02-25 LAB — COMPREHENSIVE METABOLIC PANEL
ALT: 19 U/L (ref 0–44)
AST: 18 U/L (ref 15–41)
Albumin: 3.4 g/dL — ABNORMAL LOW (ref 3.5–5.0)
Alkaline Phosphatase: 53 U/L (ref 38–126)
Anion gap: 10 (ref 5–15)
BUN: 11 mg/dL (ref 6–20)
CO2: 22 mmol/L (ref 22–32)
Calcium: 8.9 mg/dL (ref 8.9–10.3)
Chloride: 102 mmol/L (ref 98–111)
Creatinine, Ser: 0.99 mg/dL (ref 0.44–1.00)
GFR, Estimated: >60 mL/min (ref >60)
Glucose, Bld: 122 mg/dL — ABNORMAL HIGH (ref 70–99)
Potassium: 3.2 mmol/L — ABNORMAL LOW (ref 3.5–5.1)
Sodium: 134 mmol/L — ABNORMAL LOW (ref 135–145)
Total Bilirubin: 0.5 mg/dL (ref <1.2)
Total Protein: 6.9 g/dL (ref 6.5–8.1)

## 2023-02-25 LAB — CBC
HCT: 37.3 % (ref 36.0–46.0)
HCT: 35 % — ABNORMAL LOW (ref 36.0–46.0)
Hemoglobin: 11.8 g/dL — ABNORMAL LOW (ref 12.0–15.0)
Hemoglobin: 11 g/dL — ABNORMAL LOW (ref 12.0–15.0)
MCH: 25.9 pg — ABNORMAL LOW (ref 26.0–34.0)
MCH: 25.8 pg — ABNORMAL LOW (ref 26.0–34.0)
MCHC: 31.6 g/dL (ref 30.0–36.0)
MCHC: 31.4 g/dL (ref 30.0–36.0)
MCV: 81.8 fL (ref 80.0–100.0)
MCV: 82 fL (ref 80.0–100.0)
Platelets: 266 10*3/uL (ref 150–400)
Platelets: 234 10*3/uL (ref 150–400)
RBC: 4.56 MIL/uL (ref 3.87–5.11)
RBC: 4.27 MIL/uL (ref 3.87–5.11)
RDW: 13.5 % (ref 11.5–15.5)
RDW: 13.5 % (ref 11.5–15.5)
WBC: 6 10*3/uL (ref 4.0–10.5)
WBC: 9.6 10*3/uL (ref 4.0–10.5)
nRBC: 0 % (ref 0.0–0.2)
nRBC: 0 % (ref 0.0–0.2)

## 2023-02-25 LAB — URINALYSIS, ROUTINE W REFLEX MICROSCOPIC: RBC / HPF: >50 RBC/hpf (ref 0–5)

## 2023-02-25 LAB — HCG, SERUM, QUALITATIVE: Preg, Serum: NEGATIVE

## 2023-02-25 LAB — TYPE AND SCREEN
ABO/RH(D): B POS
Antibody Screen: NEGATIVE

## 2023-02-25 LAB — LIPASE, BLOOD: Lipase: 41 U/L (ref 11–51)

## 2023-02-25 NOTE — ED Provider Notes (Signed)
[place] EMERGENCY DEPARTMENT AT [facility] Provider Note   CSN: [id number] Arrival date & time: 02/25/23  0032     History  Chief Complaint  Patient presents with   Vaginal Bleeding    Diane Craig is a 37 y.o. female.  The history is provided by the patient.  Patient presents for vaginal bleeding.  Patient reports she has not had a menstrual cycle for up to 3 months.  However tonight she reports having intercourse and soon after started having heavy vaginal bleeding.  She has had abdominal cramping that is improving No fevers or vomiting.  She is not on anticoagulation.     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 Authorizing Provider  alum & mag hydroxide-simeth (ANTACID REGULAR STRENGTH) 200-200-20 MG/5ML suspension Take by mouth. 12/30/22   [provider]  cholecalciferol (VITAMIN D3) 25 MCG (1000 UNIT) tablet Take by mouth. 10/20/19   [provider]  Ferrous Sulfate (IRON PO) Take by mouth.    [provider]  fluticasone (FLONASE) 50 MCG/ACT nasal spray Place into the nose. 12/30/22 12/30/23  [provider]  VITAMIN D PO Take by mouth.    [provider]      Allergies    Flagyl [metronidazole]    Review of Systems   Review of Systems  Constitutional:  Negative for fever.  Gastrointestinal:  Positive for abdominal pain. Negative for vomiting.  Genitourinary:  Positive for vaginal bleeding.    Physical Exam Updated Vital Signs BP 101/64   Pulse 79   Temp 98 F (36.7 C) (Oral)   Resp 15   Ht 1.651 m (5\' 5" )   Wt 83.5 kg   LMP 02/25/2023    SpO2 98%   BMI 30.63 kg/m  Physical Exam CONSTITUTIONAL: Well developed/well nourished, no distress using her phone HEAD: Normocephalic/atraumatic EYES: EOMI/PERRL, conjunctiva pink ENMT: Mucous membranes moist NECK: supple no meningeal signs CV: S1/S2 noted, no murmurs/rubs/gallops noted LUNGS: Lungs are clear to auscultation bilaterally, no apparent distress ABDOMEN: soft, nontender, no rebound or guarding, bowel sounds noted throughout abdomen LK:GMWNUU exam chaperone by nurse Brayton Caves No bruising or lacerations.  Small amount of clot is noted in the vault.  There is no active hemorrhage.  Os appears closed NEURO: Pt is awake/alert/appropriate, moves all extremitiesx4.  No facial droop.   EXTREMITIES: pulses normal/equal, full ROM SKIN: warm, color normal PSYCH: no abnormalities of mood noted, alert and oriented to situation  ED Results / Procedures / Treatments   Labs (all labs ordered are listed, but only abnormal results are displayed) Labs Reviewed  COMPREHENSIVE METABOLIC PANEL - Abnormal; Notable for the following components:      Result Value   Sodium 134 (*)    Potassium 3.2 (*)    Glucose, Bld 122 (*)    Albumin 3.4 (*)    All other components within normal limits  CBC - Abnormal; Notable for the following components:   Hemoglobin 11.8 (*)    MCH 25.9 (*)    All other components within normal limits  URINALYSIS, ROUTINE W REFLEX MICROSCOPIC - Abnormal; Notable  for the following components:   Color, Urine RED (*)    APPearance TURBID (*)    Glucose, UA   (*)    Value: TEST NOT REPORTED DUE TO COLOR INTERFERENCE OF URINE PIGMENT   Hgb urine dipstick   (*)    Value: TEST NOT REPORTED DUE TO COLOR INTERFERENCE OF URINE PIGMENT   Bilirubin Urine   (*)    Value: TEST NOT REPORTED DUE TO COLOR INTERFERENCE OF URINE PIGMENT   Ketones, ur   (*)    Value: TEST NOT REPORTED DUE TO COLOR INTERFERENCE OF URINE PIGMENT   Protein, ur   (*)    Value: TEST NOT REPORTED DUE TO  COLOR INTERFERENCE OF URINE PIGMENT   Nitrite   (*)    Value: TEST NOT REPORTED DUE TO COLOR INTERFERENCE OF URINE PIGMENT   Leukocytes,Ua   (*)    Value: TEST NOT REPORTED DUE TO COLOR INTERFERENCE OF URINE PIGMENT   Bacteria, UA RARE (*)    All other components within normal limits  CBC - Abnormal; Notable for the following components:   Hemoglobin 11.0 (*)    HCT 35.0 (*)    MCH 25.8 (*)    All other components within normal limits  LIPASE, BLOOD  HCG, SERUM, QUALITATIVE  TYPE AND SCREEN    EKG None  Radiology No results found.  Procedures Procedures    Medications Ordered in ED Medications - No data to display  ED Course/ Medical Decision Making/ A&P Clinical Course as of 02/25/23 0428  Mon Feb 25, 2023  0251 Patient presents for abrupt onset of vaginal bleeding about 30 minutes after sexual intercourse.  She reports not having an menstrual cycle for up to 3 months Clinically she is in no acute distress.  Vitals are appropriate, she is not pregnant and no significant anemia.  Pelvic exam revealed small amount of clot with minimal bleeding. [DW]  3614 I had a long conversation with patient about her evaluation and management.  She does have an outpatient gynecologist. I advised that we can monitor her for any further bleeding here and recheck a CBC.  However given the nature of this illness, new medications are not warranted.  Further workup including ultrasound can be deferred to the outpatient setting. [DW]  [phone] Patient feeling improved, resting comfortably Will get repeat CBC [DW]  0426 Patient monitored for several hours. Reports she is feeling improved.  She has been resting comfortably. Minimal change in her hemoglobin.  Vital signs are appropriate.  Patient is safe for discharge home.  She is already established with a gynecologist without instructed her to call today continue her evaluation [DW]    Clinical Course User Index [DW] Zadie Rhine, MD                                  Medical Decision Making Amount and/or Complexity of Data Reviewed Labs: ordered.           Final Clinical Impression(s) / ED Diagnoses Final diagnoses:  Abnormal uterine bleeding (AUB)    Rx / DC Orders ED Discharge Orders     None         Zadie Rhine, MD 02/25/23 [phone]

## 2023-02-25 NOTE — ED Triage Notes (Signed)
For three months the pt did not have a period and she had 2 miscarriages during this  time tonight she was having sex and she just started bleeding from her vagina 30 minutes ago and she has continued to bleed profusely

## 2023-02-25 NOTE — ED Notes (Signed)
The pt is lying in the floor in the treatment area says she I more comfortable in the floor  she did not fall she just laid there

## 2023-03-19 ENCOUNTER — Emergency Department (HOSPITAL_BASED_OUTPATIENT_CLINIC_OR_DEPARTMENT_OTHER): Payer: Medicaid Other | Admitting: Radiology

## 2023-03-19 ENCOUNTER — Encounter (HOSPITAL_BASED_OUTPATIENT_CLINIC_OR_DEPARTMENT_OTHER): Payer: Self-pay

## 2023-03-19 ENCOUNTER — Other Ambulatory Visit: Payer: Self-pay

## 2023-03-19 DIAGNOSIS — R0789 Other chest pain: Secondary | ICD-10-CM | POA: Diagnosis not present

## 2023-03-19 DIAGNOSIS — Z5321 Procedure and treatment not carried out due to patient leaving prior to being seen by health care provider: Secondary | ICD-10-CM | POA: Insufficient documentation

## 2023-03-19 DIAGNOSIS — Z20822 Contact with and (suspected) exposure to covid-19: Secondary | ICD-10-CM | POA: Insufficient documentation

## 2023-03-19 DIAGNOSIS — R0602 Shortness of breath: Secondary | ICD-10-CM | POA: Diagnosis present

## 2023-03-19 LAB — CBC
HCT: 36.7 % (ref 36.0–46.0)
Hemoglobin: 11.5 g/dL — ABNORMAL LOW (ref 12.0–15.0)
MCH: 25.3 pg — ABNORMAL LOW (ref 26.0–34.0)
MCHC: 31.3 g/dL (ref 30.0–36.0)
MCV: 80.7 fL (ref 80.0–100.0)
Platelets: 274 10*3/uL (ref 150–400)
RBC: 4.55 MIL/uL (ref 3.87–5.11)
RDW: 14 % (ref 11.5–15.5)
WBC: 6.5 10*3/uL (ref 4.0–10.5)
nRBC: 0 % (ref 0.0–0.2)

## 2023-03-19 LAB — BASIC METABOLIC PANEL
Anion gap: 7 (ref 5–15)
BUN: 10 mg/dL (ref 6–20)
CO2: 26 mmol/L (ref 22–32)
Calcium: 9.3 mg/dL (ref 8.9–10.3)
Chloride: 104 mmol/L (ref 98–111)
Creatinine, Ser: 0.95 mg/dL (ref 0.44–1.00)
GFR, Estimated: >60 mL/min (ref >60)
Glucose, Bld: 105 mg/dL — ABNORMAL HIGH (ref 70–99)
Potassium: 4.1 mmol/L (ref 3.5–5.1)
Sodium: 137 mmol/L (ref 135–145)

## 2023-03-19 LAB — D-DIMER, QUANTITATIVE: D-Dimer, Quant: 0.72 ug{FEU}/mL — ABNORMAL HIGH (ref 0.00–0.50)

## 2023-03-19 LAB — RESP PANEL BY RT-PCR (RSV, FLU A&B, COVID)  RVPGX2
Influenza A by PCR: NEGATIVE
Influenza B by PCR: NEGATIVE
Resp Syncytial Virus by PCR: NEGATIVE
SARS Coronavirus 2 by RT PCR: NEGATIVE

## 2023-03-19 LAB — TROPONIN I (HIGH SENSITIVITY): Troponin I (High Sensitivity): <2 ng/L (ref <18)

## 2023-03-19 LAB — PREGNANCY, URINE: Preg Test, Ur: NEGATIVE

## 2023-03-19 NOTE — ED Triage Notes (Addendum)
 Pt reports she is here today due to sob x2 weeks. Pt reports she is unable to lay flat at night due to the sob.Pt reports mild chest discomfort. Pt denies any PMH. Pt denies using any birth control , long travels. Pt reports she is also getting sob with just mild exertion.

## 2023-03-20 ENCOUNTER — Emergency Department (HOSPITAL_BASED_OUTPATIENT_CLINIC_OR_DEPARTMENT_OTHER)
Admission: EM | Admit: 2023-03-20 | Discharge: 2023-03-20 | Discharge status: Against Medical Advice | Payer: Medicaid Other | Attending: Emergency Medicine | Admitting: Emergency Medicine

## 2023-03-20 ENCOUNTER — Encounter (HOSPITAL_BASED_OUTPATIENT_CLINIC_OR_DEPARTMENT_OTHER): Payer: Self-pay | Admitting: Emergency Medicine

## 2023-03-20 ENCOUNTER — Emergency Department (HOSPITAL_BASED_OUTPATIENT_CLINIC_OR_DEPARTMENT_OTHER): Payer: Medicaid Other

## 2023-03-20 ENCOUNTER — Other Ambulatory Visit: Payer: Self-pay

## 2023-03-20 ENCOUNTER — Emergency Department (HOSPITAL_BASED_OUTPATIENT_CLINIC_OR_DEPARTMENT_OTHER)
Admission: EM | Admit: 2023-03-20 | Discharge: 2023-03-20 | Disposition: A | Discharge status: Home / Self Care | Payer: Medicaid Other | Source: Home / Self Care | Attending: Emergency Medicine | Admitting: Emergency Medicine

## 2023-03-20 DIAGNOSIS — R0789 Other chest pain: Secondary | ICD-10-CM | POA: Insufficient documentation

## 2023-03-20 DIAGNOSIS — R0602 Shortness of breath: Secondary | ICD-10-CM | POA: Insufficient documentation

## 2023-03-20 LAB — BASIC METABOLIC PANEL
Anion gap: 4 — ABNORMAL LOW (ref 5–15)
BUN: 16 mg/dL (ref 6–20)
CO2: 28 mmol/L (ref 22–32)
Calcium: 9.2 mg/dL (ref 8.9–10.3)
Chloride: 98 mmol/L (ref 98–111)
Creatinine, Ser: 1.12 mg/dL — ABNORMAL HIGH (ref 0.44–1.00)
GFR, Estimated: >60 mL/min (ref >60)
Glucose, Bld: 87 mg/dL (ref 70–99)
Potassium: 3.9 mmol/L (ref 3.5–5.1)
Sodium: 130 mmol/L — ABNORMAL LOW (ref 135–145)

## 2023-03-20 LAB — CBC
HCT: 35.9 % — ABNORMAL LOW (ref 36.0–46.0)
Hemoglobin: 11.4 g/dL — ABNORMAL LOW (ref 12.0–15.0)
MCH: 25.8 pg — ABNORMAL LOW (ref 26.0–34.0)
MCHC: 31.8 g/dL (ref 30.0–36.0)
MCV: 81.2 fL (ref 80.0–100.0)
Platelets: 267 10*3/uL (ref 150–400)
RBC: 4.42 MIL/uL (ref 3.87–5.11)
RDW: 14 % (ref 11.5–15.5)
WBC: 7.1 10*3/uL (ref 4.0–10.5)
nRBC: 0 % (ref 0.0–0.2)

## 2023-03-20 MED ORDER — IOHEXOL 350 MG/ML SOLN
100.0000 mL | Freq: Once | INTRAVENOUS | Status: AC | PRN
  Administered 2023-03-20: 75 mL via INTRAVENOUS

## 2023-03-20 NOTE — ED Notes (Signed)
 ED Provider at bedside.

## 2023-03-20 NOTE — ED Triage Notes (Signed)
 Sob and chest pian x 2 weeks Came to ed last night but left due to wait.  Seen by PCP today and sent for ct due to elevated ddimer

## 2023-03-20 NOTE — Discharge Instructions (Signed)
 Your lab work and imaging today and yesterday are very reassuring.  Please follow-up with primary care to ensure resolution of symptoms.  Please take daily iron  as discussed.  If shortness of breath continues you can ask for pulmonology referral, if needed. Return with new or worsening symptoms.

## 2023-03-20 NOTE — ED Provider Notes (Signed)
 Diane Craig Provider Note   CSN: [id number] Arrival date & time: 03/20/23  1820     History  Chief Complaint  Patient presents with   Shortness of Breath    Diane Craig is a 38 y.o. female with past medical history of anxiety, depression reporting to emergency room with shortness of breath that is been ongoing for 2 weeks and was worse last night.  Has had some chest discomfort intermittently, but none today, no chest tightness.  Has not noticed shortness of breath worse with exertion.  Has no history of pulmonary embolism.  No unilateral calf swelling or tenderness.  Patient is not on birth control, not a smoker no recent travel no recent surgery.  Patient had elevated D-dimer and was sent here from primary care for PE study. Denies fever, cough, chest pain, no abdominal pain nausea vomiting diarrhea.    Shortness of Breath      Home Medications Prior to Admission medications   Medication Sig Start Date End Date Taking? Authorizing Provider  alum & mag hydroxide-simeth (ANTACID REGULAR STRENGTH) 200-200-20 MG/5ML suspension Take by mouth. 12/30/22   [provider]  cholecalciferol (VITAMIN D3) 25 MCG (1000 UNIT) tablet Take by mouth. 10/20/19   [provider]  Ferrous Sulfate (IRON  PO) Take by mouth.    [provider]  fluticasone (FLONASE) 50 MCG/ACT nasal spray Place into the nose. 12/30/22 12/30/23  [provider]  VITAMIN D PO Take by mouth.    [provider]      Allergies    Flagyl  [metronidazole ]    Review of Systems   Review of Systems  Respiratory:  Positive for shortness of breath.     Physical Exam Updated Vital Signs BP 118/80 (BP Location: Right Arm)   Pulse 76   Temp 97.9 F (36.6 C)   Resp 18   LMP 03/17/2023   SpO2 99%  Physical Exam Vitals and nursing note reviewed.  Constitutional:      General: She is not in acute distress.    Appearance: She is  not toxic-appearing.  HENT:     Head: Normocephalic and atraumatic.  Eyes:     General: No scleral icterus.    Conjunctiva/sclera: Conjunctivae normal.  Cardiovascular:     Rate and Rhythm: Normal rate and regular rhythm.     Pulses: Normal pulses.     Heart sounds: Normal heart sounds.  Pulmonary:     Effort: Pulmonary effort is normal. No respiratory distress.     Breath sounds: Normal breath sounds.  Abdominal:     General: Abdomen is flat. Bowel sounds are normal.     Palpations: Abdomen is soft.     Tenderness: There is no abdominal tenderness.  Musculoskeletal:     Right lower leg: No edema.     Left lower leg: No edema.  Skin:    General: Skin is warm and dry.     Findings: No lesion.  Neurological:     General: No focal deficit present.     Mental Status: She is alert and oriented to person, place, and time. Mental status is at baseline.     ED Results / Procedures / Treatments   Labs (all labs ordered are listed, but only abnormal results are displayed) Labs Reviewed  BASIC METABOLIC PANEL - Abnormal; Notable for the following components:      Result Value   Sodium 130 (*)    Creatinine, Ser 1.12 (*)  Anion gap 4 (*)    All other components within normal limits  CBC - Abnormal; Notable for the following components:   Hemoglobin 11.4 (*)    HCT 35.9 (*)    MCH 25.8 (*)    All other components within normal limits    EKG None  Radiology DG Chest 2 View Result Date: 03/19/2023 CLINICAL DATA:  Chest pain EXAM: CHEST - 2 VIEW COMPARISON:  10/29/2021 FINDINGS: The heart size and mediastinal contours are within normal limits. Both lungs are clear. The visualized skeletal structures are unremarkable. IMPRESSION: Normal study. Electronically Signed   By: Franky Crease M.D.   On: 03/19/2023 23:13    Procedures Procedures    Medications Ordered in ED Medications - No data to display  ED Course/ Medical Decision Making/ A&P                                  Medical Decision Making Amount and/or Complexity of Data Reviewed Labs: ordered. Radiology: ordered.  Risk Prescription drug management.   Diane Craig 38 y.o. presented today for shortness of breath.  Working DDx that I considered at this time includes, but not limited to, asthma/COPD exacerbation, URI, viral illness, anemia, ACS, PE, pneumonia, pleural effusion, lung mass.  R/o DDx: These are considered less likely due to history of present illness, physical exam, labs/imaging findings  Pmhx: anxiety, depression   Review of prior external notes: Reviewed labs imaging and EKG from yesterday.  With no significant abnormality other than elevated D-dimer. Left without seeing provider. Had normal troponin, as well. Normal resp panel.   Unique Tests and My Interpretation:  CBC without leukocytosis, hemoglobin 11.4 which is stable.  BMP without malady.  CTA Chest PE: Negative for PE, no acute abnormality    Problem List / ED Course / Critical interventions / Medication management  Patient reporting with shortness of breath.  Shortness of breath is intermittent and not associated with infectious symptoms.  Patient has never had anything like this in the past. Not currently experiencing shortness of breath or chest pain. No sign of respiratory distress or wheezing on exam.  Patient was sent here primarily because of elevated D-dimer will obtain CT to rule out pulmonary embolism.  Offered medication however patient declined.  Vital stable no tachycardia, no hypoxia. Will not repeat labs as symptoms have not changed since yesterday and currently without symptoms. No sign of DVT on exam, no known risk factors.  Patient does not feel fluid overloaded, no sign of fluid overload on exam.  Stable for discharge.  Offered medications, patient declines.  Patients vitals assessed. Upon arrival patient is hemodynamically stable.  I have reviewed the patients home medicines and have made adjustments as  needed   Plan:  F/u w/ PCP in 2-3d to ensure resolution of sx.  Patient was given return precautions. Patient stable for discharge at this time.  Patient educated on sx/dx and verbalized understanding of plan. Will return to ER for new or worsening sx.          Final Clinical Impression(s) / ED Diagnoses Final diagnoses:  Shortness of breath    Rx / DC Orders ED Discharge Orders     None         Shermon Warren SAILOR, PA-C 03/21/23 1428    Jerral Meth, MD 03/22/23 1458

## 2023-05-02 ENCOUNTER — Emergency Department (HOSPITAL_BASED_OUTPATIENT_CLINIC_OR_DEPARTMENT_OTHER)
Admission: EM | Admit: 2023-05-02 | Discharge: 2023-05-02 | Disposition: A | Discharge status: Home / Self Care | Payer: Medicaid Other | Attending: Emergency Medicine | Admitting: Emergency Medicine

## 2023-05-02 ENCOUNTER — Other Ambulatory Visit: Payer: Self-pay

## 2023-05-02 ENCOUNTER — Encounter (HOSPITAL_BASED_OUTPATIENT_CLINIC_OR_DEPARTMENT_OTHER): Payer: Self-pay

## 2023-05-02 ENCOUNTER — Emergency Department (HOSPITAL_BASED_OUTPATIENT_CLINIC_OR_DEPARTMENT_OTHER): Payer: Medicaid Other | Admitting: Radiology

## 2023-05-02 DIAGNOSIS — R0602 Shortness of breath: Secondary | ICD-10-CM | POA: Insufficient documentation

## 2023-05-02 DIAGNOSIS — R079 Chest pain, unspecified: Secondary | ICD-10-CM

## 2023-05-02 DIAGNOSIS — R0789 Other chest pain: Secondary | ICD-10-CM | POA: Insufficient documentation

## 2023-05-02 LAB — CBC
HCT: 40.6 % (ref 36.0–46.0)
Hemoglobin: 12.4 g/dL (ref 12.0–15.0)
MCH: 25.4 pg — ABNORMAL LOW (ref 26.0–34.0)
MCHC: 30.5 g/dL (ref 30.0–36.0)
MCV: 83 fL (ref 80.0–100.0)
Platelets: 298 10*3/uL (ref 150–400)
RBC: 4.89 MIL/uL (ref 3.87–5.11)
RDW: 13.5 % (ref 11.5–15.5)
WBC: 6.7 10*3/uL (ref 4.0–10.5)
nRBC: 0 % (ref 0.0–0.2)

## 2023-05-02 LAB — BASIC METABOLIC PANEL
Anion gap: 7 (ref 5–15)
BUN: 13 mg/dL (ref 6–20)
CO2: 28 mmol/L (ref 22–32)
Calcium: 9.6 mg/dL (ref 8.9–10.3)
Chloride: 102 mmol/L (ref 98–111)
Creatinine, Ser: 1.07 mg/dL — ABNORMAL HIGH (ref 0.44–1.00)
GFR, Estimated: >60 mL/min (ref >60)
Glucose, Bld: 83 mg/dL (ref 70–99)
Potassium: 3.6 mmol/L (ref 3.5–5.1)
Sodium: 137 mmol/L (ref 135–145)

## 2023-05-02 LAB — TROPONIN I (HIGH SENSITIVITY): Troponin I (High Sensitivity): <2 ng/L (ref <18)

## 2023-05-02 LAB — HCG, SERUM, QUALITATIVE: Preg, Serum: NEGATIVE

## 2023-05-02 NOTE — ED Provider Notes (Signed)
 [place] EMERGENCY DEPARTMENT AT [facility] Provider Note   CSN: [id number] Arrival date & time: 05/02/23  1921     History  Chief Complaint  Patient presents with   Chest Pain   Shortness of Breath    Diane Craig is a 38 y.o. female.   Chest Pain Associated symptoms: shortness of breath   Shortness of Breath Associated symptoms: chest pain    Pt has been having cramping tightness in her chest ongoing for weeks.  It is coming and going.  SHe is feeling short of breath more constantly over that same time.  Slight cough.  No abdominal pain.  No leg swelling.  No history of heart or lung problems.  Patient had a similar episode back in January of this year.  At that time she had an ED evaluation that did include a CT angiogram of the chest that did not show any evidence of pulmonary embolism    Home Medications Prior to Admission medications   Medication Sig Start Date End Date Taking? Authorizing Provider  alum & mag hydroxide-simeth (ANTACID REGULAR STRENGTH) 200-200-20 MG/5ML suspension Take by mouth. 12/30/22   [provider]  cholecalciferol (VITAMIN D3) 25 MCG (1000 UNIT) tablet Take by mouth. 10/20/19   [provider]  Ferrous Sulfate (IRON PO) Take by mouth.    [provider]  fluticasone (FLONASE) 50 MCG/ACT nasal spray Place into the nose. 12/30/22 12/30/23  [provider]  VITAMIN D PO Take by mouth.    [provider]      Allergies    Flagyl [metronidazole]    Review of Systems   Review of Systems  Respiratory:  Positive for shortness of breath.   Cardiovascular:  Positive for chest pain.    Physical Exam Updated Vital Signs BP 120/80 (BP Location: Right Arm)   Pulse 72   Temp 97.8 F (36.6 C)   Resp 18   Ht 1.651 m (5\' 5" )   Wt 85.3 kg   LMP 04/24/2023 (Approximate)   SpO2 100%   BMI 31.28 kg/m  Physical Exam Vitals and nursing note reviewed.  Constitutional:      General: She is  not in acute distress.    Appearance: She is well-developed.  HENT:     Head: Normocephalic and atraumatic.     Right Ear: External ear normal.     Left Ear: External ear normal.  Eyes:     General: No scleral icterus.       Right eye: No discharge.        Left eye: No discharge.     Conjunctiva/sclera: Conjunctivae normal.  Neck:     Trachea: No tracheal deviation.  Cardiovascular:     Rate and Rhythm: Normal rate and regular rhythm.  Pulmonary:     Effort: Pulmonary effort is normal. No respiratory distress.     Breath sounds: Normal breath sounds. No stridor. No wheezing or rales.  Abdominal:     General: Bowel sounds are normal. There is no distension.     Palpations: Abdomen is soft.     Tenderness: There is no abdominal tenderness. There is no guarding or rebound.  Musculoskeletal:        General: No tenderness or deformity.     Cervical back: Neck supple.  Skin:    General: Skin is warm and dry.     Findings: No rash.  Neurological:     General: No focal deficit present.     Mental  Status: She is alert.     Cranial Nerves: No cranial nerve deficit, dysarthria or facial asymmetry.     Sensory: No sensory deficit.     Motor: No abnormal muscle tone or seizure activity.     Coordination: Coordination normal.  Psychiatric:        Mood and Affect: Mood normal.     ED Results / Procedures / Treatments   Labs (all labs ordered are listed, but only abnormal results are displayed) Labs Reviewed  BASIC METABOLIC PANEL - Abnormal; Notable for the following components:      Result Value   Creatinine, Ser 1.07 (*)    All other components within normal limits  CBC - Abnormal; Notable for the following components:   MCH 25.4 (*)    All other components within normal limits  HCG, SERUM, QUALITATIVE  TROPONIN I (HIGH SENSITIVITY)    EKG EKG Interpretation Date/Time:  Thursday May 02 2023 19:27:42 EST Ventricular Rate:  74 PR Interval:  202 QRS Duration:  68 QT  Interval:  364 QTC Calculation: 404 R Axis:   49  Text Interpretation: Normal sinus rhythm When compared with ECG of 20-Mar-2023 20:26, No significant change since last tracing Reconfirmed by Linwood Dibbles [phone]) on 05/02/2023 7:45:01 PM  Radiology DG Chest Port 1 View Result Date: 05/02/2023 CLINICAL DATA:  10031 Cough 10031 EXAM: PORTABLE CHEST 1 VIEW COMPARISON:  Chest x-ray 03/19/2023, CT chest 03/20/2023 FINDINGS: The heart and mediastinal contours are within normal limits. No focal consolidation. No pulmonary edema. No pleural effusion. No pneumothorax. No acute osseous abnormality. IMPRESSION: No active disease. Electronically Signed   By: Tish Frederickson M.D.   On: 05/02/2023 19:43    Procedures Procedures    Medications Ordered in ED Medications - No data to display  ED Course/ Medical Decision Making/ A&P Clinical Course as of 05/02/23 2057  Thu May 02, 2023  2036 Pregnancy test negative.  CBC normal. [JK]  2037 Chest x-ray without acute findings [JK]  2047 CBC metabolic panel normal.  Troponin normal [JK]    Clinical Course User Index [JK] Linwood Dibbles, MD             HEART Score: 0                Geneva (Revised) Score: 0, Geneva Score Interpretation: Low Risk Group: 7-9% incidence of pulmonary embolism from several studies PERC Score: 0, PERC Score Interpretation: No need for further workup, as <2% chance of PE.  If no criteria are positive and clinicians pre-test probability is <15%, PERC Rule criteria are satisfied Medical Decision Making Amount and/or Complexity of Data Reviewed Labs: ordered. Radiology: ordered.   Patient presents ED with complaints of chest pain shortness of breath.  Symptoms ongoing for several weeks now.  ED workup reassuring.  Patient is not anemic.  No electrolyte abnormalities.  Troponin is normal.  EKG is unremarkable.  Low risk for PE PERC negative.  Patient also had a CT angio of the chest about a month ago that did not show any acute  abnormalities.  Doubt PE or ACS.  Pt appears stable for discharge but with ongoing sx will refer to cardiology also recommend follow up with PCP          Final Clinical Impression(s) / ED Diagnoses Final diagnoses:  Chest pain, unspecified type    Rx / DC Orders ED Discharge Orders          Ordered    Ambulatory referral to  Cardiology       Comments: If you have not heard from the Cardiology office within the next 72 hours please call [phone].   05/02/23 0981              Linwood Dibbles, MD 05/02/23 2057

## 2023-05-02 NOTE — Discharge Instructions (Addendum)
 The test today in the ED were reassuring.  Follow-up with your primary care doctor to be rechecked.  I have also ordered a cardiology referral.  They should call you to schedule an appointment.

## 2023-05-02 NOTE — ED Notes (Signed)
 Blue top sent to lab.

## 2023-05-02 NOTE — ED Triage Notes (Signed)
 Pt reports constant SHOB over the last couple weeks and today started having cramping chest pain. Pt denies any other symptoms and otherwise has been feeling well.

## 2023-06-27 ENCOUNTER — Ambulatory Visit (HOSPITAL_BASED_OUTPATIENT_CLINIC_OR_DEPARTMENT_OTHER): Admitting: Cardiology

## 2023-07-10 ENCOUNTER — Ambulatory Visit: Payer: Medicaid Other | Admitting: Cardiovascular Disease

## 2023-10-10 NOTE — Progress Notes (Signed)
 " Subjective Patient ID: Diane Craig is a 38 y.o. female.  Chief Complaint  Patient presents with   Shoulder Pain    L arm and shoulder pain since yesterday morning. Last night it got much worse to the point she was unable to move it. Got some relief with icehot and a ice pack.    Fever    Since last night, took dimetapp this morning, states it was the only thing she had.    The following information was reviewed by members of the visit team:  Tobacco  Allergies  Meds  Med Hx  Surg Hx  OB Status      38 year old female presents for evaluation of left shoulder pain which began 1 day ago without known injury.  States shoulder began feeling sore last night, symptoms are worse this morning.  Pain is now excruciating and making it difficult to move her arm.  She feels symptoms may be related to the way she sleeps at night.  She has mild pain in the shoulder at rest, pain mainly present and is definitely worse with movement.  Denies associated numbness or tingling in the hands or fingers.  Denies associated chest pain or shortness of breath.  No associated neck pain.  States this morning she felt like she had a fever, felt warm but did not check temp with a thermometer.  She did take some children's Dimetapp for same.  Denies recent cough, congestion, sore throat, ear pain or other recent illness.    Review of Systems  Constitutional:  Negative for chills and fever.  HENT:  Negative for congestion, ear pain, rhinorrhea, sinus pressure, sinus pain and sore throat.   Respiratory:  Negative for cough, chest tightness, shortness of breath and wheezing.   Cardiovascular:  Negative for chest pain.  Gastrointestinal:  Negative for diarrhea, nausea and vomiting.  Musculoskeletal:  Positive for arthralgias. Negative for back pain, myalgias, neck pain and neck stiffness.  Skin:  Negative for rash and wound.  Neurological:  Negative for dizziness, weakness, numbness and headaches.   Hematological:  Negative for adenopathy. Does not bruise/bleed easily.    Objective Physical Exam Vitals reviewed.  Constitutional:      General: She is not in acute distress.    Appearance: Normal appearance. She is not ill-appearing, toxic-appearing or diaphoretic.  HENT:     Right Ear: Tympanic membrane, ear canal and external ear normal.     Left Ear: Tympanic membrane, ear canal and external ear normal.     Nose: Nose normal. No congestion or rhinorrhea.     Mouth/Throat:     Mouth: Mucous membranes are moist.     Pharynx: No oropharyngeal exudate or posterior oropharyngeal erythema.   Eyes:     Conjunctiva/sclera: Conjunctivae normal.    Cardiovascular:     Rate and Rhythm: Normal rate and regular rhythm.     Pulses: Normal pulses.  Pulmonary:     Effort: Pulmonary effort is normal. No respiratory distress.     Breath sounds: Normal breath sounds.   Musculoskeletal:     Cervical back: Normal range of motion and neck supple. No rigidity or tenderness.     Comments: Point tenderness to palpation left anterior shoulder which clearly reproduces pain.  There is no appreciable swelling, no deformity.  No obvious sign of recent injury or infectious process.  There is no point tenderness distally in the left arm.  No tenderness to palpation in the neck.  Range of motion at the  shoulder is decreased secondary to pain.  NVI distally   Skin:    General: Skin is warm and dry.     Capillary Refill: Capillary refill takes less than 2 seconds.     Findings: No rash.   Neurological:     Mental Status: She is alert and oriented to person, place, and time.   Psychiatric:        Mood and Affect: Mood normal.     Assessment/Plan  39 year old female with 2-day history of left shoulder pain without known injury.  Symptoms and exam seem consistent with tendinitis of the left shoulder.  Offered x-ray for evaluation despite no history of injury, patient declines at present.  There is no  sign of infectious process at present. Prescription for Mobic.  Recommend rest, ice.  Also recommend follow-up with orthopedics as needed for persistent symptoms. Urgent Care Disposition:  Home Care   Electronically signed: Franky Floria Finder, PA-C 10/10/2023  8:56 AM   "

## 2024-03-06 NOTE — Patient Instructions (Addendum)
 Drink plenty of fluids.  Sleep upright, use humidifier at night.  Zyrtec, saline nasal rinses, Flonase for nasal congestion.  Salt water gargles, hot tea with honey, popsicles, Chloraseptic lozenges for sore throat.  Over-the-counter cough medication of choice such as Robitussin or Mucinex.    Take Tamiflu as prescribed.  Take with food.  Stop medication immediately if you develop any negative side effects.  Alternate tylenol  and ibuprofen  as needed for pain or fevers.  Monitor for any new or worsening symptoms which should be reevaluated including difficulty swallowing, shortness of breath.  Follow up with PCP.

## 2024-03-06 NOTE — Progress Notes (Signed)
 " Subjective Patient ID: Diane Craig is a 38 y.o. female.  Chief Complaint  Patient presents with   Cough   Sore Throat   Fever   Fatigue    All s/s started yesterday    The following information was reviewed by members of the visit team:  Tobacco  Allergies  Meds  Problems  Med Hx  Surg Hx  OB Status   Fam Hx  Soc Hx     38 year old female presents with complaints of fatigue, sore throat, cough, tactile fevers since yesterday.  Denies runny nose, congestion, abdominal pain, vomiting, diarrhea.  Denies chest pain, shortness of breath, palpitations, headaches, dizziness.  Has been eating and drinking well.  Normal urination.  Positive sick contacts at home.  Fianc recently tested positive for the flu.  Cough Associated symptoms include a fever and a sore throat. Pertinent negatives include no headaches, myalgias, rash, rhinorrhea or shortness of breath.  Sore Throat  Associated symptoms include coughing. Pertinent negatives include no abdominal pain, congestion, diarrhea, headaches, shortness of breath, trouble swallowing or vomiting.  Fever  Associated symptoms include coughing and a sore throat. Pertinent negatives include no abdominal pain, congestion, diarrhea, headaches, nausea, rash, urinary pain or vomiting.  Fatigue Associated symptoms include coughing, fatigue, a fever and a sore throat. Pertinent negatives include no abdominal pain, arthralgias, congestion, headaches, myalgias, nausea, rash, vomiting or weakness.    Review of Systems  Constitutional:  Positive for fatigue and fever. Negative for activity change and appetite change.  HENT:  Positive for sore throat. Negative for congestion, rhinorrhea and trouble swallowing.   Eyes:  Negative for visual disturbance.  Respiratory:  Positive for cough. Negative for shortness of breath.   Gastrointestinal:  Negative for abdominal pain, diarrhea, nausea and vomiting.  Genitourinary:  Negative for decreased  urine volume, difficulty urinating, dysuria, flank pain and frequency.  Musculoskeletal:  Negative for arthralgias and myalgias.  Skin:  Negative for color change and rash.  Neurological:  Negative for dizziness, weakness, light-headedness and headaches.  Psychiatric/Behavioral:  Negative for behavioral problems.     Objective Physical Exam Vitals and nursing note reviewed.  Constitutional:      Appearance: Normal appearance.  HENT:     Head: Normocephalic and atraumatic.     Right Ear: Tympanic membrane, ear canal and external ear normal. No drainage, swelling or tenderness. No middle ear effusion. No mastoid tenderness. Tympanic membrane is not perforated, erythematous or bulging.     Left Ear: Tympanic membrane, ear canal and external ear normal. No drainage, swelling or tenderness.  No middle ear effusion. No mastoid tenderness. Tympanic membrane is not perforated, erythematous or bulging.     Nose: Nose normal. No congestion or rhinorrhea.     Mouth/Throat:     Mouth: Mucous membranes are moist. No oral lesions.     Pharynx: Oropharynx is clear. Uvula midline. No pharyngeal swelling, oropharyngeal exudate, posterior oropharyngeal erythema or uvula swelling.     Tonsils: No tonsillar exudate or tonsillar abscesses. 2+ on the right. 2+ on the left.     Comments: No trismus or drooling. Eyes:     Extraocular Movements: Extraocular movements intact.     Conjunctiva/sclera: Conjunctivae normal.     Pupils: Pupils are equal, round, and reactive to light.  Cardiovascular:     Rate and Rhythm: Normal rate and regular rhythm.     Pulses: Normal pulses.          Radial pulses are 2+ on the right side  and 2+ on the left side.     Heart sounds: Normal heart sounds.  Pulmonary:     Effort: Pulmonary effort is normal.     Breath sounds: No decreased breath sounds, wheezing or rhonchi.  Abdominal:     General: There is no distension.     Palpations: Abdomen is soft.     Tenderness: There  is no abdominal tenderness. There is no right CVA tenderness, left CVA tenderness, guarding or rebound.  Musculoskeletal:        General: Normal range of motion.     Cervical back: Normal range of motion and neck supple. No rigidity or tenderness.  Lymphadenopathy:     Cervical: No cervical adenopathy.  Skin:    General: Skin is warm and dry.  Neurological:     General: No focal deficit present.     Mental Status: She is alert and oriented to person, place, and time.  Psychiatric:        Behavior: Behavior is cooperative.     Assessment/Plan Diagnoses and all orders for this visit:  Flu-like symptoms -     POC Influenza A&B NAT (IDNOW) -     POC SARS-COV-2 SYMPTOMATIC (IDNOW)  Exposure to influenza  Other orders -     oseltamivir (TAMIFLU) 75 mg capsule; Take 1 capsule (75 mg total) by mouth 2 (two) times a day for 5 days.   Patient presents ambulatory, A&O, VSS, NAD, nontoxic appearing. Speaking in clear fluent sentences. Breathing even, regular, unlabored respirations. Physical exam as described. Posterior pharynx clear, without erythema, tonsillar swelling or exudate. No PTA. No cervical adenopathy. Full ROM neck without pain or rigidity. Low concern for RPA, meningitis. No AOM or EOM. No mastoiditis. Lung sounds clear bilaterally without wheezing or rhonchi. Regular RR, O2 sat 100% RA. Low concern for pneumonia at this time. Abdomen soft without tenderness, distention, rigidity, rebound. Appears clinically well hydrated.  Covid, flu negative.  Results and reassuring physical exam discussed with patient.  [place] and daughter tested positive for flu A today.  High likelihood too early to test /false negative.  Mom would like Tamiflu, sent to pharmacy.  Risk versus benefits versus side effects discussed.  Viral process discussed.  Supportive care discussed.  Strict return precautions discussed.  Recommend follow-up with PCP.  Patient agreeable without further questions or  concerns.  Urgent Care Disposition:  Follow up with PCP   Electronically signed: Alan Dorothyann Gaudier, NP 03/06/2024  10:05 AM   "

## 2024-04-15 NOTE — Telephone Encounter (Signed)
 Last Office Visit 07/2021 Patient needs an appointment to obtain further refills in the future. Refused per protocol.  Waddell Massie Marking, RN

## 2024-04-16 ENCOUNTER — Observation Stay (HOSPITAL_COMMUNITY)
Admission: AD | Admit: 2024-04-16 | Source: Home / Self Care | Attending: Obstetrics and Gynecology | Admitting: Obstetrics and Gynecology

## 2024-04-16 ENCOUNTER — Inpatient Hospital Stay (HOSPITAL_COMMUNITY)

## 2024-04-16 ENCOUNTER — Other Ambulatory Visit: Payer: Self-pay

## 2024-04-16 ENCOUNTER — Encounter (HOSPITAL_COMMUNITY): Payer: Self-pay | Admitting: Obstetrics and Gynecology

## 2024-04-16 DIAGNOSIS — O00202 Left ovarian pregnancy without intrauterine pregnancy: Principal | ICD-10-CM

## 2024-04-16 DIAGNOSIS — O009 Unspecified ectopic pregnancy without intrauterine pregnancy: Secondary | ICD-10-CM | POA: Diagnosis present

## 2024-04-16 HISTORY — DX: Nausea with vomiting, unspecified: R11.2

## 2024-04-16 LAB — CBC WITH DIFFERENTIAL/PLATELET
Abs Immature Granulocytes: 0.02 10*3/uL (ref 0.00–0.07)
Basophils Absolute: 0 10*3/uL (ref 0.0–0.1)
Basophils Relative: 0 %
Eosinophils Absolute: 0.1 10*3/uL (ref 0.0–0.5)
Eosinophils Relative: 1 %
HCT: 40 % (ref 36.0–46.0)
Hemoglobin: 12.7 g/dL (ref 12.0–15.0)
Immature Granulocytes: 0 %
Lymphocytes Relative: 19 %
Lymphs Abs: 1.4 10*3/uL (ref 0.7–4.0)
MCH: 26.2 pg (ref 26.0–34.0)
MCHC: 31.8 g/dL (ref 30.0–36.0)
MCV: 82.6 fL (ref 80.0–100.0)
Monocytes Absolute: 0.6 10*3/uL (ref 0.1–1.0)
Monocytes Relative: 8 %
Neutro Abs: 5.6 10*3/uL (ref 1.7–7.7)
Neutrophils Relative %: 72 %
Platelets: 291 10*3/uL (ref 150–400)
RBC: 4.84 MIL/uL (ref 3.87–5.11)
RDW: 14.2 % (ref 11.5–15.5)
WBC: 7.7 10*3/uL (ref 4.0–10.5)
nRBC: 0 % (ref 0.0–0.2)

## 2024-04-16 LAB — COMPREHENSIVE METABOLIC PANEL WITH GFR
ALT: 19 U/L (ref 0–44)
AST: 16 U/L (ref 15–41)
Albumin: 4.2 g/dL (ref 3.5–5.0)
Alkaline Phosphatase: 58 U/L (ref 38–126)
Anion gap: 10 (ref 5–15)
BUN: 9 mg/dL (ref 6–20)
CO2: 26 mmol/L (ref 22–32)
Calcium: 9.8 mg/dL (ref 8.9–10.3)
Chloride: 101 mmol/L (ref 98–111)
Creatinine, Ser: 0.96 mg/dL (ref 0.44–1.00)
GFR, Estimated: >60 mL/min
Glucose, Bld: 110 mg/dL — ABNORMAL HIGH (ref 70–99)
Potassium: 3.8 mmol/L (ref 3.5–5.1)
Sodium: 136 mmol/L (ref 135–145)
Total Bilirubin: 0.3 mg/dL (ref 0.0–1.2)
Total Protein: 7.6 g/dL (ref 6.5–8.1)

## 2024-04-16 LAB — HCG, QUANTITATIVE, PREGNANCY: hCG, Beta Chain, Quant, S: 22357 m[IU]/mL — ABNORMAL HIGH

## 2024-04-16 MED ORDER — ONDANSETRON HCL 4 MG/2ML IJ SOLN
4.0000 mg | Freq: Four times a day (QID) | INTRAMUSCULAR | Status: AC | PRN
  Administered 2024-04-17: 4 mg via INTRAVENOUS

## 2024-04-16 MED ORDER — ONDANSETRON HCL 4 MG PO TABS: 4.0000 mg | ORAL_TABLET | Freq: Four times a day (QID) | ORAL | Status: AC | PRN

## 2024-04-16 MED ORDER — LACTATED RINGERS IV SOLN: INTRAVENOUS | Status: DC

## 2024-04-16 MED ORDER — ZOLPIDEM TARTRATE 5 MG PO TABS: 5.0000 mg | ORAL_TABLET | Freq: Every evening | ORAL | Status: AC | PRN

## 2024-04-16 MED ORDER — METHOTREXATE FOR ECTOPIC PREGNANCY: 50.0000 mg/m2 | Freq: Once | INTRAMUSCULAR | Status: DC

## 2024-04-16 NOTE — Progress Notes (Signed)
 PT called for Triage and not in lobby. Rocky Satterfield NP aware

## 2024-04-16 NOTE — Progress Notes (Signed)
 Pt not in either lobby

## 2024-04-16 NOTE — Progress Notes (Signed)
 Called patient at 2116 to determine location. States she went home due to the wait time. Had multiple questions about her diagnosis & plan of care from [facility].  Discussed ectopic pregnancy management options & risks with patient. Patient interested in second opinion & repeat imaging. Encouraged to return MAU for u/s and to discuss management. Reviewed risks of ruptured ectopic pregnancy & recommend returning to MAU asap.   Patient returned to unit at 2155.    Rocky Satterfield

## 2024-04-16 NOTE — Progress Notes (Signed)
 PT not in either lobby

## 2024-04-16 NOTE — MAU Note (Signed)
 Diane Craig is a 39 y.o. at Unknown here in MAU reporting: had first US  appointment in office today - showed ectopic pregnancy and was told to come here. Pt had left earlier prior to being seen, but had blood drawn. Pt talked to Jerilynn, NP on the phone and decided to come back in to determine POC. Reports pelvic pain yesterday, but not today. Having pink/bright red spotting.   LMP: 03/02/24 Pain score: 0 Vitals:   04/16/24 2206  BP: 119/70  Pulse: 78  Resp: 18  Temp: 99.1 F (37.3 C)  SpO2: 100%     FHT: NA  Lab orders placed from triage: bypass triage.

## 2024-04-16 NOTE — MAU Provider Note (Signed)
 " History     CSN: [id number]  Arrival date and time: 04/16/24 1759   Event Date/Time   First Provider Initiated Contact with Patient 04/16/24 2213      Chief Complaint  Patient presents with   Ectopic Pregnancy   HPI Diane Craig is a 39 y.o. [id number] at [redacted]w[redacted]d who presents for ectopic pregnancy.  Was seen at [facility] this morning & had ultrasound consistent with left ectopic pregnancy. Per patient she was told to come to MAU for methotrexate .  Denies abdominal pain or vaginal bleeding.    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   Panic disorder without agoraphobia 04/22/2014   Preterm delivery without spontaneous labor 01/09/2012   Induction for Preeclampsia    Past Surgical History:  Procedure Laterality Date   CHOLECYSTECTOMY  01/2019   COLPOSCOPY  2013   WISDOM TOOTH EXTRACTION      Family History  Problem Relation Age of Onset   Drug abuse Maternal Grandmother    Hypercholesterolemia Mother    Anxiety disorder Neg Hx    Bipolar disorder Neg Hx    Depression Neg Hx    Alcohol abuse Neg Hx    Suicidality Neg Hx     Social History[1]  Allergies: Allergies[2]  Medications Prior to Admission  Medication Sig Dispense Refill Last Dose/Taking   Prenatal Vit-Fe Fumarate-FA (MULTIVITAMIN-PRENATAL) 27-0.8 MG TABS tablet Take 1 tablet by mouth daily at 12 noon.   04/16/2024   alum & mag hydroxide-simeth (ANTACID REGULAR STRENGTH) 200-200-20 MG/5ML suspension Take by mouth.      cholecalciferol (VITAMIN D3) 25 MCG (1000 UNIT) tablet Take by mouth.      Ferrous Sulfate (IRON  PO) Take by mouth.      fluticasone (FLONASE) 50 MCG/ACT nasal spray Place into the nose.       VITAMIN D PO Take by mouth.       Review of Systems  All other systems reviewed and are negative.  Physical Exam   Blood pressure 119/70, pulse 78, temperature 99.1 F (37.3 C), temperature source Oral, resp. rate 18, height 5' 5 (1.651 m), weight 81.6 kg, last menstrual period 03/02/2024, SpO2 100%.  Physical Exam Vitals and nursing note reviewed.  Constitutional:      General: She is not in acute distress.    Appearance: She is well-developed. She is not ill-appearing.  HENT:     Head: Normocephalic and atraumatic.  Eyes:     General: No scleral icterus.       Right eye: No discharge.        Left eye: No discharge.     Conjunctiva/sclera: Conjunctivae normal.  Pulmonary:     Effort: Pulmonary effort is normal. No respiratory distress.  Neurological:     General: No focal deficit present.     Mental Status: She is alert.  Psychiatric:        Mood and  Affect: Mood normal.        Behavior: Behavior normal.     MAU Course  Procedures Results for orders placed or performed during the hospital encounter of 04/16/24 (from the past 24 hours)  hCG, quantitative, pregnancy     Status: Abnormal   Collection Time: 04/16/24  6:28 PM  Result Value Ref Range   hCG, Beta Chain, Quant, S 22,357 (H) <5 mIU/mL  CBC with Differential/Platelet     Status: None   Collection Time: 04/16/24  6:28 PM  Result Value Ref Range   WBC 7.7 4.0 - 10.5 K/uL   RBC 4.84 3.87 - 5.11 MIL/uL   Hemoglobin 12.7 12.0 - 15.0 g/dL   HCT 59.9 63.9 - 53.9 %   MCV 82.6 80.0 - 100.0 fL   MCH 26.2 26.0 - 34.0 pg   MCHC 31.8 30.0 - 36.0 g/dL   RDW 85.7 88.4 - 84.4 %   Platelets 291 150 - 400 K/uL   nRBC 0.0 0.0 - 0.2 %   Neutrophils Relative % 72 %   Neutro Abs 5.6 1.7 - 7.7 K/uL   Lymphocytes Relative 19 %   Lymphs Abs 1.4 0.7 - 4.0 K/uL   Monocytes Relative 8 %   Monocytes Absolute 0.6 0.1 - 1.0 K/uL   Eosinophils Relative 1 %   Eosinophils Absolute 0.1 0.0 - 0.5 K/uL   Basophils Relative 0 %    Basophils Absolute 0.0 0.0 - 0.1 K/uL   Immature Granulocytes 0 %   Abs Immature Granulocytes 0.02 0.00 - 0.07 K/uL  Comprehensive metabolic panel     Status: Abnormal   Collection Time: 04/16/24  6:28 PM  Result Value Ref Range   Sodium 136 135 - 145 mmol/L   Potassium 3.8 3.5 - 5.1 mmol/L   Chloride 101 98 - 111 mmol/L   CO2 26 22 - 32 mmol/L   Glucose, Bld 110 (H) 70 - 99 mg/dL   BUN 9 6 - 20 mg/dL   Creatinine, Ser 9.03 0.44 - 1.00 mg/dL   Calcium 9.8 8.9 - 89.6 mg/dL   Total Protein 7.6 6.5 - 8.1 g/dL   Albumin 4.2 3.5 - 5.0 g/dL   AST 16 15 - 41 U/L   ALT 19 0 - 44 U/L   Alkaline Phosphatase 58 38 - 126 U/L   Total Bilirubin 0.3 0.0 - 1.2 mg/dL   GFR, Estimated >39 >39 mL/min   Anion gap 10 5 - 15  Type and screen     Status: None (Preliminary result)   Collection Time: 04/16/24 11:23 PM  Result Value Ref Range   ABO/RH(D) PENDING    Antibody Screen PENDING    Sample Expiration      04/19/2024,2359 Performed at [facility], [address] [place], [place] [address]    US  OB LESS THAN 14 WEEKS WITH OB TRANSVAGINAL Result Date: 04/16/2024 EXAM: ULTRASOUND FIRST TRIMESTER TECHNIQUE: Transvaginal first trimester obstetric pelvic duplex ultrasound was performed with real-time imaging, color flow Doppler imaging, and spectral analysis. COMPARISON: None available. CLINICAL HISTORY: 809019 Ectopic pregnancy without intrauterine pregnancy 190980 Ectopic pregnancy without intrauterine pregnancy. ICD10: O00.90 Ectopic pregnancy without intrauterine pregnancy, unspecified. FINDINGS: UTERUS: The endometrium measures 4 mm. There is a small amount of fluid in the endometrial canal. GESTATIONAL SAC(S): No intrauterine gestational sac. Within the left adnexa there is a 4.6 x 3.8 x 4.7 cm mass containing a gestational sac. YOLK SAC: No yolk sac identified. EMBRYO(<11WK) /FETUS(>=11WK): Single embryo. CROWN RUMP  LENGTH: 19.7 mm. RATE OF CARDIAC ACTIVITY: No cardiac activity identified.  RIGHT OVARY: The right ovary appears normal. Right ovary measures 2.5 x 3.3 x 2.2 cm. LEFT OVARY: No separate left ovary identified. The 4.6 x 3.8 x 4.7 cm mass containing the gestational sac is located in the left adnexa, likely representing the left ovary containing the pregnancy. FREE FLUID: No free fluid. MEASUREMENTS ESTIMATED GESTATIONAL AGE BY CURRENT ULTRASOUND: 8 weeks and 4 days. IMPRESSION: 1. Left adnexal ectopic pregnancy (possibly within the left ovary) with embryo measuring 8 weeks 4 days and no cardiac activity. 2. No intrauterine pregnancy identified. 3. Small amount of fluid in the endometrial canal. Electronically signed by: Greig Pique MD 04/16/2024 11:03 PM EST RP Workstation: [id number]    MDM  -Patient initially went home after labs were drawn & prior to being triaged. Upon return of labs that were ordered by the [facility] CNM, patient could not be found & pharmacy was requesting a provider to review results.  -See previous note regarding phone call with patient requesting her to return to hospital.  -Dr. Armond notified of situation. Will defer management to [facility] attending.   Assessment and Plan   1. Left ovarian pregnancy without intrauterine pregnancy    -Ultrasound consistent with ectopic pregnancy, appears to be in left ovary. Dr. Izell on unit to discussed results & management plan with patient.   Rocky Satterfield 04/16/2024, 11:43 PM      [1]  Social History Tobacco Use   Smoking status: Never   Smokeless tobacco: Never  Vaping Use   Vaping status: Never Used  Substance Use Topics   Alcohol use: Not Currently    Alcohol/week: 1.0 standard drink of alcohol    Types: 1 Glasses of wine per week    Comment: Occas   Drug use: No  [2]  Allergies Allergen Reactions   Flagyl  [Metronidazole ] Swelling   "

## 2024-04-16 NOTE — H&P (Signed)
 Obstetrics & Gynecology H&P**Consult Note***  Date of Admission***Consult***: 04/16/2024   Requesting Provider: ***  Primary OBGYN: *** Primary Care Provider: Mavis Redge SAILOR  Reason for Admission***Consult***: ***  History of Present Illness: Diane Craig is a 39 y.o. [phone] (Patient's last menstrual period was 03/02/2024.), with the above chief complaint. Past medical history is significant for ***.    ROS: A 12-point review of systems was performed and negative, except as stated in the above HPI.  OBGYN History: As per HPI. OB History  Gravida Para Term Preterm AB Living  4 1 0 1 2 1   SAB IAB Ectopic Multiple Live Births  2 0 0 0 1    # Outcome Date GA Lbr Len/2nd Weight Sex Type Anes PTL Lv  4 Current           3 SAB 2025          2 SAB 2024          1 Preterm 01/09/12 [redacted]w[redacted]d 15:26 / 00:21 2200 g F Vag-Spont EPI  LIV    Menarche age *** Periods: *** History of pap smears: {yes no free text:20080}. Last pap smear *** History of STIs: {yes no free text:20080} She is currently using {DESC; CONTRACEPTION:21143} for contraception.  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   Panic disorder without agoraphobia 04/22/2014   Preterm delivery without spontaneous labor 01/09/2012   Induction for Preeclampsia   Past Surgical History: Past Surgical History:  Procedure Laterality Date   CHOLECYSTECTOMY  01/2019   COLPOSCOPY  2013   WISDOM TOOTH EXTRACTION     Family History:  Family History  Problem Relation Age of Onset   Drug abuse Maternal Grandmother    Hypercholesterolemia Mother    Anxiety disorder Neg Hx    Bipolar disorder Neg Hx    Depression Neg Hx    Alcohol abuse Neg Hx    Suicidality Neg Hx    She *** any female cancers, bleeding or blood clotting disorders.  Social  History:  Social History   Socioeconomic History   Marital status: Single    Spouse name: Not on file   Number of children: 1   Years of education: 16   Highest education level: Not on file  Occupational History   Occupation: CUSTOMER SERVICE REP  Tobacco Use   Smoking status: Never   Smokeless tobacco: Never  Vaping Use   Vaping status: Never Used  Substance and Sexual Activity   Alcohol use: Not Currently    Alcohol/week: 1.0 standard drink of alcohol    Types: 1 Glasses of wine per week    Comment: Occas   Drug use: No   Sexual activity: Yes    Partners: Male  Other Topics Concern   Not on file  Social History Narrative   Right handed   Lives at home with her child    Caffeine 1 cup daily    Social Drivers of Health   Tobacco Use: Low Risk (04/16/2024)   Patient History    Smoking Tobacco Use: Never    Smokeless Tobacco Use: Never    Passive Exposure: Not on file  Financial Resource Strain: Low Risk (08/26/2023)   Received from [facility]   Overall Financial Resource Strain (CARDIA)  Difficulty of Paying Living Expenses: Not hard at all  Food Insecurity: No Food Insecurity (08/26/2023)   Received from [facility]   Epic    Within the past 12 months, you worried that your food would run out before you got the money to buy more.: Never true    Within the past 12 months, the food you bought just didn't last and you didn't have money to get more.: Never true  Transportation Needs: No Transportation Needs (08/26/2023)   Received from [facility]   PRAPARE - Transportation    Lack of Transportation (Medical): No    Lack of Transportation (Non-Medical): No  Physical Activity: Insufficiently Active (08/26/2023)   Received from [facility]   Exercise Vital Sign    On average, how many days per week do you engage in moderate to strenuous exercise (like a brisk walk)?: 2 days    On average, how many minutes do you engage in exercise at this level?: 30 min   Stress: No Stress Concern Present (08/26/2023)   Received from [facility] of Occupational Health - Occupational Stress Questionnaire    Feeling of Stress : Only a little  Recent Concern: Stress - Stress Concern Present (06/04/2023)   Received from [facility] of Occupational Health - Occupational Stress Questionnaire    Feeling of Stress : To some extent  Social Connections: Socially Integrated (08/26/2023)   Received from [facility]   Social Network    How would you rate your social network (family, work, friends)?: Good participation with social networks  Recent Concern: Social Connections - Socially Isolated (06/04/2023)   Received from [facility]   Social Network    How would you rate your social network (family, work, friends)?: Little participation, lonely and socially isolated  Intimate Partner Violence: Not At Risk (08/26/2023)   Received from [facility]   HITS    Over the last 12 months how often did your partner scream or curse at you?: Never    Over the last 12 months how often did your partner threaten you with physical harm?: Never    Over the last 12 months how often did your partner insult you or talk down to you?: Never    Over the last 12 months how often did your partner physically hurt you?: Never  Depression (PHQ2-9): Not on file  Alcohol Screen: Not on file  Housing: Low Risk (03/06/2024)   Received from [facility]   Epic    What is your living situation today?: I have a steady place to live    Think about the place you live. Do you have problems with any of the following? Choose all that apply:: Not on file  Utilities: Not At Risk (08/26/2023)   Received from [facility] Utilities    Threatened with loss of utilities: No  Health Literacy: Not on file   Health Maintenance:  Mammogram: {yes no free text:20080}, which was done *** Colonoscopy: {yes no free text:20080}, which was done *** Flu shot:  {yes no free text:20080}, which was done in *** Allergy: Allergies[1] Current Outpatient Medications: Medications Prior to Admission  Medication Sig Dispense Refill Last Dose/Taking   Prenatal Vit-Fe Fumarate-FA (MULTIVITAMIN-PRENATAL) 27-0.8 MG TABS tablet Take 1 tablet by mouth daily at 12 noon.   04/16/2024   alum & mag hydroxide-simeth (ANTACID REGULAR STRENGTH) 200-200-20 MG/5ML suspension Take by mouth.      cholecalciferol (VITAMIN D3) 25  MCG (1000 UNIT) tablet Take by mouth.      Ferrous Sulfate (IRON  PO) Take by mouth.      fluticasone (FLONASE) 50 MCG/ACT nasal spray Place into the nose.      VITAMIN D PO Take by mouth.      Hospital Medications: No current facility-administered medications for this encounter.    Physical Exam: Vitals:   04/16/24 2158 04/16/24 2206  BP:  119/70  Pulse:  78  Resp:  18  Temp:  99.1 F (37.3 C)  TempSrc:  Oral  SpO2:  100%  Weight: 81.6 kg   Height: 5' 5 (1.651 m)     Temp:  [99.1 F (37.3 C)] 99.1 F (37.3 C) (02/05 2206) Pulse Rate:  [78] 78 (02/05 2206) Resp:  [18] 18 (02/05 2206) BP: (119)/(70) 119/70 (02/05 2206) SpO2:  [100 %] 100 % (02/05 2206) Weight:  [81.6 kg] 81.6 kg (02/05 2158) No intake/output data recorded. No intake/output data recorded. No intake or output data in the 24 hours ending 04/16/24 2342   Current Vital Signs 24h Vital Sign Ranges  T 99.1 F (37.3 C) Temp  Avg: 99.1 F (37.3 C)  Min: 99.1 F (37.3 C)  Max: 99.1 F (37.3 C)  BP 119/70 BP  Min: 119/70  Max: 119/70  HR 78 Pulse  Avg: 78  Min: 78  Max: 78  RR 18 Resp  Avg: 18  Min: 18  Max: 18  SaO2 100 % Room Air SpO2  Avg: 100 %  Min: 100 %  Max: 100 %       24 Hour I/O Current Shift I/O  Time Ins Outs No intake/output data recorded. No intake/output data recorded.   Patient Vitals for the past 24 hrs:  BP Temp Temp src Pulse Resp SpO2 Height Weight  04/16/24 2206 119/70 99.1 F (37.3 C) Oral 78 18 100 % -- --  04/16/24 2158 -- -- -- -- --  -- 5' 5 (1.651 m) 81.6 kg    ***choose the best VS arrangement*** Body mass index is 29.95 kg/m. General appearance: Well nourished, well developed female in no acute distress.  Neck:  Supple, normal appearance, and no thyromegaly  Cardiovascular: {EXAM; YZJMU:81484} Respiratory:  Clear to auscultation bilateral. Normal respiratory effort Abdomen: positive bowel sounds and no masses, hernias; diffusely non tender to palpation, non distended Breasts: {pe breast exam:315056::breasts appear normal, no suspicious masses, no skin or nipple changes or axillary nodes}. Neuro/Psych:  Normal mood and affect.  Skin:  Warm and dry.  Extremities: no clubbing, cyanosis, or edema.  Lymphatic:  No inguinal lymphadenopathy.   Pelvic exam: {is/is not:21397} limited by body habitus EGBUS: within normal limits, Vagina: within normal limits and with {no/minimal/moderate:23685} blood in the vault, Cervix:  {CERVIX:21269}, Uterus:  {DESC; UTERUS:21270} and approximately *** week sized, and Adnexa:  {DESC; ADNEXA:21271}  Laboratory: Beta HCG: *** *** Recent Labs  Lab 04/16/24 1828  WBC 7.7  HGB 12.7  HCT 40.0  PLT 291   Recent Labs  Lab 04/16/24 1828  NA 136  K 3.8  CL 101  CO2 26  BUN 9  CREATININE 0.96  CALCIUM 9.8  PROT 7.6  BILITOT 0.3  ALKPHOS 58  ALT 19  AST 16  GLUCOSE 110*   No results for input(s): APTT, INR, PTT in the last 168 hours.  Invalid input(s): DRHAPTT Recent Labs  Lab 04/16/24 2323  [facility] PENDING   Imaging:  ***  Assessment: Ms. Moon is a 40 y.o. [phone] (  Patient's last menstrual period was 03/02/2024.) who presented to the ED with complaints of ***; findings are consistent with ***.  Plan: *** * *** *FEN/GI: *** *PPx: *** *Pain: *** *Dispo: ***  Total time taking care of the patient was *** minutes, with greater than 50% of the time spent in face to face interaction with the patient.***  Bebe Izell Raddle MD Attending Center for  [facility]) GYN Consult Phone: [phone] (M-F, 0800-1700) & [phone] (Off hours, weekends, holidays)     [1]  Allergies Allergen Reactions   Flagyl  [Metronidazole ] Swelling

## 2024-04-17 ENCOUNTER — Encounter (HOSPITAL_COMMUNITY): Admission: AD | Payer: Self-pay | Source: Home / Self Care | Attending: Obstetrics and Gynecology

## 2024-04-17 ENCOUNTER — Inpatient Hospital Stay (HOSPITAL_COMMUNITY)

## 2024-04-17 ENCOUNTER — Encounter (HOSPITAL_COMMUNITY): Payer: Self-pay | Admitting: Obstetrics and Gynecology

## 2024-04-17 LAB — TYPE AND SCREEN

## 2024-04-17 MED ORDER — LACTATED RINGERS IV SOLN: INTRAVENOUS | Status: DC

## 2024-04-17 MED ORDER — BUPIVACAINE HCL (PF) 0.25 % IJ SOLN
INTRAMUSCULAR | Status: AC
  Filled 2024-04-17: qty 30

## 2024-04-17 MED ORDER — DEXAMETHASONE SOD PHOSPHATE PF 10 MG/ML IJ SOLN
INTRAMUSCULAR | Status: DC | PRN
  Administered 2024-04-17: 10 mg via INTRAVENOUS

## 2024-04-17 MED ORDER — CHLORHEXIDINE GLUCONATE 0.12 % MT SOLN
OROMUCOSAL | Status: AC
  Administered 2024-04-17: 15 mL via OROMUCOSAL
  Filled 2024-04-17: qty 15

## 2024-04-17 MED ORDER — OXYCODONE HCL 5 MG PO TABS: 5.0000 mg | ORAL_TABLET | Freq: Once | ORAL | Status: DC | PRN

## 2024-04-17 MED ORDER — ROCURONIUM BROMIDE 10 MG/ML (PF) SYRINGE
PREFILLED_SYRINGE | INTRAVENOUS | Status: DC | PRN
  Administered 2024-04-17: 60 mg via INTRAVENOUS

## 2024-04-17 MED ORDER — MIDAZOLAM HCL (PF) 2 MG/2ML IJ SOLN
INTRAMUSCULAR | Status: DC | PRN
  Administered 2024-04-17: 2 mg via INTRAVENOUS

## 2024-04-17 MED ORDER — SUGAMMADEX SODIUM 200 MG/2ML IV SOLN
INTRAVENOUS | Status: DC | PRN
  Administered 2024-04-17: 200 mg via INTRAVENOUS

## 2024-04-17 MED ORDER — OXYCODONE HCL 5 MG PO TABS: 5.0000 mg | ORAL_TABLET | ORAL | Status: AC | PRN

## 2024-04-17 MED ORDER — FENTANYL CITRATE (PF) 100 MCG/2ML IJ SOLN
INTRAMUSCULAR | Status: AC
  Filled 2024-04-17: qty 2

## 2024-04-17 MED ORDER — FENTANYL CITRATE (PF) 100 MCG/2ML IJ SOLN: 25.0000 ug | INTRAMUSCULAR | Status: DC | PRN

## 2024-04-17 MED ORDER — OXYCODONE HCL 5 MG/5ML PO SOLN: 5.0000 mg | Freq: Once | ORAL | Status: DC | PRN

## 2024-04-17 MED ORDER — ACETAMINOPHEN 10 MG/ML IV SOLN
INTRAVENOUS | Status: AC
  Filled 2024-04-17: qty 100

## 2024-04-17 MED ORDER — ONDANSETRON HCL 4 MG/2ML IJ SOLN: 4.0000 mg | Freq: Once | INTRAMUSCULAR | Status: DC | PRN

## 2024-04-17 MED ORDER — ACETAMINOPHEN 10 MG/ML IV SOLN
INTRAVENOUS | Status: DC | PRN
  Administered 2024-04-17: 1000 mg via INTRAVENOUS

## 2024-04-17 MED ORDER — DEXMEDETOMIDINE HCL IN NACL 80 MCG/20ML IV SOLN
INTRAVENOUS | Status: AC
  Filled 2024-04-17: qty 20

## 2024-04-17 MED ORDER — LIDOCAINE 2% (20 MG/ML) 5 ML SYRINGE
INTRAMUSCULAR | Status: DC | PRN
  Administered 2024-04-17: 80 mg via INTRAVENOUS

## 2024-04-17 MED ORDER — SODIUM CHLORIDE 0.9 % IR SOLN
Status: DC | PRN
  Administered 2024-04-17: 1000 mL
  Administered 2024-04-17: 1

## 2024-04-17 MED ORDER — KETOROLAC TROMETHAMINE 30 MG/ML IJ SOLN
INTRAMUSCULAR | Status: DC | PRN
  Administered 2024-04-17: 30 mg via INTRAVENOUS

## 2024-04-17 MED ORDER — GLYCOPYRROLATE PF 0.2 MG/ML IJ SOSY
PREFILLED_SYRINGE | INTRAMUSCULAR | Status: AC
  Filled 2024-04-17: qty 1

## 2024-04-17 MED ORDER — IBUPROFEN 600 MG PO TABS: 600.0000 mg | ORAL_TABLET | Freq: Four times a day (QID) | ORAL | Status: AC

## 2024-04-17 MED ORDER — ORAL CARE MOUTH RINSE: 15.0000 mL | Freq: Once | OROMUCOSAL | Status: AC

## 2024-04-17 MED ORDER — CEFAZOLIN SODIUM-DEXTROSE 2-3 GM-%(50ML) IV SOLR
INTRAVENOUS | Status: DC | PRN
  Administered 2024-04-17: 2 g via INTRAVENOUS

## 2024-04-17 MED ORDER — MIDAZOLAM HCL 2 MG/2ML IJ SOLN
INTRAMUSCULAR | Status: AC
  Filled 2024-04-17: qty 2

## 2024-04-17 MED ORDER — ACETAMINOPHEN 10 MG/ML IV SOLN: 1000.0000 mg | Freq: Once | INTRAVENOUS | Status: DC | PRN

## 2024-04-17 MED ORDER — PROPOFOL 1000 MG/100ML IV EMUL
INTRAVENOUS | Status: AC
  Filled 2024-04-17: qty 100

## 2024-04-17 MED ORDER — LIDOCAINE 2% (20 MG/ML) 5 ML SYRINGE
INTRAMUSCULAR | Status: AC
  Filled 2024-04-17: qty 5

## 2024-04-17 MED ORDER — CHLORHEXIDINE GLUCONATE 0.12 % MT SOLN: 15.0000 mL | Freq: Once | OROMUCOSAL | Status: AC

## 2024-04-17 MED ORDER — PROPOFOL 10 MG/ML IV BOLUS
INTRAVENOUS | Status: DC | PRN
  Administered 2024-04-17: 150 mg via INTRAVENOUS

## 2024-04-17 MED ORDER — CEFAZOLIN SODIUM 1 G IJ SOLR
INTRAMUSCULAR | Status: AC
  Filled 2024-04-17: qty 20

## 2024-04-17 MED ORDER — FENTANYL CITRATE (PF) 250 MCG/5ML IJ SOLN
INTRAMUSCULAR | Status: DC | PRN
  Administered 2024-04-17: 50 ug via INTRAVENOUS
  Administered 2024-04-17: 100 ug via INTRAVENOUS
  Administered 2024-04-17 (×2): 50 ug via INTRAVENOUS

## 2024-04-17 MED ORDER — DEXMEDETOMIDINE HCL IN NACL 80 MCG/20ML IV SOLN
INTRAVENOUS | Status: DC | PRN
  Administered 2024-04-17: 8 ug via INTRAVENOUS

## 2024-04-17 MED ORDER — DIPHENHYDRAMINE HCL 50 MG/ML IJ SOLN
INTRAMUSCULAR | Status: AC
  Filled 2024-04-17: qty 1

## 2024-04-17 MED ORDER — ONDANSETRON HCL 4 MG/2ML IJ SOLN
INTRAMUSCULAR | Status: AC
  Filled 2024-04-17: qty 2

## 2024-04-17 MED ORDER — FENTANYL CITRATE (PF) 250 MCG/5ML IJ SOLN
INTRAMUSCULAR | Status: AC
  Filled 2024-04-17: qty 5

## 2024-04-17 MED ORDER — PHENYLEPHRINE 80 MCG/ML (10ML) SYRINGE FOR IV PUSH (FOR BLOOD PRESSURE SUPPORT)
PREFILLED_SYRINGE | INTRAVENOUS | Status: AC
  Filled 2024-04-17: qty 10

## 2024-04-17 MED ORDER — DIPHENHYDRAMINE HCL 50 MG/ML IJ SOLN
INTRAMUSCULAR | Status: DC | PRN
  Administered 2024-04-17: 12.5 mg via INTRAVENOUS

## 2024-04-17 MED ORDER — ROCURONIUM BROMIDE 10 MG/ML (PF) SYRINGE
PREFILLED_SYRINGE | INTRAVENOUS | Status: AC
  Filled 2024-04-17: qty 10

## 2024-04-17 MED ORDER — KETOROLAC TROMETHAMINE 30 MG/ML IJ SOLN
INTRAMUSCULAR | Status: AC
  Filled 2024-04-17: qty 1

## 2024-04-17 MED ORDER — BUPIVACAINE HCL (PF) 0.25 % IJ SOLN
INTRAMUSCULAR | Status: DC | PRN
  Administered 2024-04-17: 20 mL

## 2024-04-17 MED ORDER — ACETAMINOPHEN 500 MG PO TABS: 1000.0000 mg | ORAL_TABLET | Freq: Four times a day (QID) | ORAL | Status: AC

## 2024-04-17 MED ORDER — KETOROLAC TROMETHAMINE 30 MG/ML IJ SOLN: 30.0000 mg | Freq: Four times a day (QID) | INTRAMUSCULAR | Status: AC

## 2024-04-17 MED ORDER — LACTATED RINGERS IV SOLN: INTRAVENOUS | Status: AC

## 2024-04-17 MED ORDER — DEXAMETHASONE SOD PHOSPHATE PF 10 MG/ML IJ SOLN
INTRAMUSCULAR | Status: AC
  Filled 2024-04-17: qty 1

## 2024-04-17 NOTE — MAU Note (Signed)
Pt transported to 6N 

## 2024-04-17 NOTE — Transfer of Care (Signed)
 Immediate Anesthesia Transfer of Care Note  Patient: Diane Craig  Procedure(s) Performed: LAPAROSCOPY, DIAGNOSTIC SALPINGECTOMY, UNILATERAL, LAPAROSCOPIC (Left)  Patient Location: PACU  Anesthesia Type:General  Level of Consciousness: awake, alert , oriented, and patient cooperative  Airway & Oxygen Therapy: Patient connected to face mask oxygen  Post-op Assessment: Report given to RN, Post -op Vital signs reviewed and stable, and Patient moving all extremities X 4  Post vital signs: Reviewed and stable  Last Vitals:  Vitals Value Taken Time  BP 96/60 04/17/24 20:15  Temp    Pulse 89 04/17/24 20:20  Resp 20 04/17/24 20:20  SpO2 100 % 04/17/24 20:20  Vitals shown include unfiled device data.  Last Pain:  Vitals:   04/17/24 1538  TempSrc:   PainSc: 0-No pain      Patients Stated Pain Goal: 0 (04/17/24 1538)  Complications: No notable events documented.

## 2024-04-17 NOTE — Anesthesia Preprocedure Evaluation (Signed)
"                                    Anesthesia Evaluation  Patient identified by MRN, date of birth, ID band Patient awake    Reviewed: Allergy & Precautions, NPO status , Patient's Chart, lab work & pertinent test results, reviewed documented beta blocker date and time   History of Anesthesia Complications (+) PONV and history of anesthetic complications  Airway Mallampati: II  TM Distance: >3 FB     Dental no notable dental hx.    Pulmonary neg COPD   breath sounds clear to auscultation       Cardiovascular (-) CAD, (-) Past MI and (-) Cardiac Stents  Rhythm:Regular Rate:Normal     Neuro/Psych neg Seizures PSYCHIATRIC DISORDERS Anxiety Depression     Neuromuscular disease    GI/Hepatic ,neg GERD  ,,(+) neg Cirrhosis        Endo/Other    Renal/GU Renal disease     Musculoskeletal   Abdominal   Peds  Hematology   Anesthesia Other Findings   Reproductive/Obstetrics                              Anesthesia Physical Anesthesia Plan  ASA: 2  Anesthesia Plan: General   Post-op Pain Management:    Induction: Intravenous  PONV Risk Score and Plan: 3 and Ondansetron  and Dexamethasone   Airway Management Planned: Oral ETT  Additional Equipment:   Intra-op Plan:   Post-operative Plan: Extubation in OR  Informed Consent: I have reviewed the patients History and Physical, chart, labs and discussed the procedure including the risks, benefits and alternatives for the proposed anesthesia with the patient or authorized representative who has indicated his/her understanding and acceptance.     Dental advisory given  Plan Discussed with: CRNA  Anesthesia Plan Comments:         Anesthesia Quick Evaluation  "

## 2024-04-17 NOTE — Anesthesia Procedure Notes (Signed)
 Procedure Name: Intubation Date/Time: 04/17/2024 6:17 PM  Performed by: Roddie Grate, CRNAPre-anesthesia Checklist: Patient identified, Emergency Drugs available, Suction available, Patient being monitored and Timeout performed Patient Re-evaluated:Patient Re-evaluated prior to induction Oxygen Delivery Method: Circle system utilized Preoxygenation: Pre-oxygenation with 100% oxygen Induction Type: IV induction Ventilation: Mask ventilation without difficulty Laryngoscope Size: Mac and 3 Grade View: Grade I Tube type: Oral Tube size: 7.0 mm Number of attempts: 1 Airway Equipment and Method: Stylet Placement Confirmation: ETT inserted through vocal cords under direct vision, positive ETCO2 and breath sounds checked- equal and bilateral Secured at: 22 cm Tube secured with: Tape Dental Injury: Teeth and Oropharynx as per pre-operative assessment  Comments: Smooth IV Induction. Eyes taped. Easy mask. DL x 1 with grade 1 view. Atraumatically placed, teeth and lip remain intact as pre-op. Secured with tape. Bilateral breath sounds +/=, EtCO2 +, Adequate TV, VSS.

## 2024-04-17 NOTE — Progress Notes (Addendum)
`  Subjective:    Denies any pain. Reports that she has felt mild cramping once or twice in the past two weeks. Reviewed US  report and discussed the rationale for surgical management. Informed pt that final decision will be determined by the surgeon, Dr. Henry.  Objective:    VS: BP 117/70 (BP Location: Left Arm)   Pulse 73   Temp 98.1 F (36.7 C)   Resp 18   Ht 5' 5 (1.651 m)   Wt 81.6 kg   LMP 03/02/2024   SpO2 100%   BMI 29.95 kg/m  Gen: AAO, no distress Card: RRR, no additional sounds Resp: clear x4, unlabored Abd: soft, non-tender GI: BS x4 GU: neg for bleeding Ext: neg for pain, tenderness, edema, and cords  Assessment/Plan:   39 y.o. [id number] [redacted]w[redacted]d Possible ovarian ectopic pregnancy at 8w 4d without cardiac activity  NPO since midnight Dr. Henry aware  Mercer KATHEE Peal DNP, CNM 04/17/2024 1:41 PM  Abdomen benign exam, no calf tenderness. I have spoken to the patient and reviewed the ultrasounds and discussed the risks benefits and alternatives including but not limited to bleeding infection and injury.  Questions answered and consent signed and witnessed.

## 2024-04-17 NOTE — Op Note (Signed)
 Preop Diagnosis: Ectopic pregnancy   Postop Diagnosis: Ectopic pregnancy   Procedure: Lap Left Salpingectomy along with Removal of Ectopic Pregnancy  Anesthesia: General   Anesthesiologist: Niels Marien CROME, MD   Attending: Henry Slough, MD   Assistant: Surgical Tech  Findings: Large left ectopic about 5-6cm  Pathology: Left Fallopian Tube with Ectopic  Fluids: 1800 cc  UOP: 200 cc  EBL: 300 cc  Complications: None  Procedure:  The patient was taken to the operating room after the risks, benefits, alternatives, complications, treatment options, and expected outcomes were discussed with the patient. The patient verbalized understanding, the patient concurred with the proposed plan and consent signed and witnessed. The patient was taken to the Operating Room, identified as Room 6 and the procedure verified as laparoscopic salpingectomy. A Time Out was held and the above information confirmed.  The patient was placed under general anesthesia per anesthesia staff, the patient was placed in modified dorsal lithotomy position and was prepped, draped, and catheterized in the normal, sterile fashion.  The cervix was visualized and an intrauterine manipulator was placed. A 10 mm umbilical incision was then performed. Veress needle was passed and pneumoperitoneum was established. A 10 mm trocar was advanced into the intra-abdominal cavity, the straight laparoscope was introduced and findings as noted above.  A 10 mm incision was made 2 cm above the symphysis pubis and trocar advanced into the intra-abdominal cavity under direct visualization.  A 5 mm incision was made in the RLQ and 5 mm trocar advanced into the intra-abdominal cavity under direct visualization.  The left fallopian tube with ectopic pregnancy was identified and carried out to its fimbriated end and excised with the ligasure.  The ectopic ruptured during this process.  The tube with ectopic was placed in endopouch and  removed via 10mm suprapubic port.  The intra-abdominal cavity was copiously irrigated.  The 10 mm suprapubic trocar was removed under direct visualization and 10 mm fascial incision repaired with 0 vicryl in a running fashion and the skin repaired with 4-0 monocryl via a subcuticular stitch.  The 5 mm RLQ trocar was removed under direct visualization.  Pneumoperitoneum was released and laparoscope was removed under direct visualization.  The fascia at the umbilicus was repaired with a running stitch of 0 vicryl and the skin was reapproximated with 4-0 monocryl via a subcuticular stitch and dermabond was applied.  The hulka tenaculum was removed and foley to be removed by OR staff before transfer to PACU.  Sponge, lap and needle counts were correct.  Patient tolerated the procedure well and was returned to the recovery room in good condition.

## 2024-04-17 NOTE — Plan of Care (Signed)
